# Patient Record
Sex: Female | Born: 1970 | Race: White | Hispanic: Yes | Smoking: Never smoker
Health system: Southern US, Community
[De-identification: ages and names within clinical notes are randomized; demographics above are authoritative.]

## PROBLEM LIST (undated history)

## (undated) DIAGNOSIS — I10 Essential (primary) hypertension: Secondary | ICD-10-CM

## (undated) HISTORY — PX: ABDOMINAL SURGERY: SHX537

## (undated) HISTORY — DX: Essential (primary) hypertension: I10

---

## 2004-12-16 HISTORY — PX: CERVICAL BIOPSY  W/ LOOP ELECTRODE EXCISION: SUR135

## 2009-03-24 ENCOUNTER — Other Ambulatory Visit: Admission: RE | Admit: 2009-03-24 | Discharge: 2009-03-24 | Payer: Self-pay | Admitting: Gynecology

## 2009-03-24 ENCOUNTER — Encounter: Payer: Self-pay | Admitting: Gynecology

## 2009-03-24 ENCOUNTER — Ambulatory Visit: Payer: Self-pay | Admitting: Gynecology

## 2009-04-12 ENCOUNTER — Ambulatory Visit (HOSPITAL_COMMUNITY): Admission: RE | Admit: 2009-04-12 | Discharge: 2009-04-12 | Payer: Self-pay | Admitting: Gynecology

## 2014-03-09 ENCOUNTER — Ambulatory Visit (INDEPENDENT_AMBULATORY_CARE_PROVIDER_SITE_OTHER): Payer: BC Managed Care – PPO | Admitting: Gynecology

## 2014-03-09 ENCOUNTER — Other Ambulatory Visit (HOSPITAL_COMMUNITY)
Admission: RE | Admit: 2014-03-09 | Discharge: 2014-03-09 | Disposition: A | Discharge status: Home / Self Care | Payer: BC Managed Care – PPO | Source: Ambulatory Visit | Attending: Gynecology | Admitting: Gynecology

## 2014-03-09 ENCOUNTER — Encounter: Payer: Self-pay | Admitting: Gynecology

## 2014-03-09 VITALS — BP 130/88 | Ht 62.75 in | Wt 129.0 lb

## 2014-03-09 DIAGNOSIS — Z01419 Encounter for gynecological examination (general) (routine) without abnormal findings: Secondary | ICD-10-CM | POA: Insufficient documentation

## 2014-03-09 DIAGNOSIS — N920 Excessive and frequent menstruation with regular cycle: Secondary | ICD-10-CM

## 2014-03-09 DIAGNOSIS — Z1151 Encounter for screening for human papillomavirus (HPV): Secondary | ICD-10-CM | POA: Insufficient documentation

## 2014-03-09 DIAGNOSIS — Z23 Encounter for immunization: Secondary | ICD-10-CM

## 2014-03-09 DIAGNOSIS — N841 Polyp of cervix uteri: Secondary | ICD-10-CM | POA: Insufficient documentation

## 2014-03-09 LAB — CBC WITH DIFFERENTIAL/PLATELET
Basophils Absolute: 0 10*3/uL (ref 0.0–0.1)
Basophils Relative: 0 % (ref 0–1)
EOS ABS: 0.2 10*3/uL (ref 0.0–0.7)
Eosinophils Relative: 2 % (ref 0–5)
HCT: 40.2 % (ref 36.0–46.0)
HEMOGLOBIN: 13.7 g/dL (ref 12.0–15.0)
LYMPHS ABS: 1.7 10*3/uL (ref 0.7–4.0)
LYMPHS PCT: 18 % (ref 12–46)
MCH: 28.6 pg (ref 26.0–34.0)
MCHC: 34.1 g/dL (ref 30.0–36.0)
MCV: 83.9 fL (ref 78.0–100.0)
MONOS PCT: 10 % (ref 3–12)
Monocytes Absolute: 1 10*3/uL (ref 0.1–1.0)
NEUTROS ABS: 6.7 10*3/uL (ref 1.7–7.7)
NEUTROS PCT: 70 % (ref 43–77)
PLATELETS: 269 10*3/uL (ref 150–400)
RBC: 4.79 MIL/uL (ref 3.87–5.11)
RDW: 13.9 % (ref 11.5–15.5)
WBC: 9.5 10*3/uL (ref 4.0–10.5)

## 2014-03-09 LAB — COMPREHENSIVE METABOLIC PANEL
ALBUMIN: 4.5 g/dL (ref 3.5–5.2)
ALT: 19 U/L (ref 0–35)
AST: 20 U/L (ref 0–37)
Alkaline Phosphatase: 73 U/L (ref 39–117)
BILIRUBIN TOTAL: 0.3 mg/dL (ref 0.2–1.2)
BUN: 13 mg/dL (ref 6–23)
CO2: 28 meq/L (ref 19–32)
Calcium: 9.4 mg/dL (ref 8.4–10.5)
Chloride: 104 mEq/L (ref 96–112)
Creat: 0.62 mg/dL (ref 0.50–1.10)
Glucose, Bld: 104 mg/dL — ABNORMAL HIGH (ref 70–99)
POTASSIUM: 4.1 meq/L (ref 3.5–5.3)
SODIUM: 138 meq/L (ref 135–145)
TOTAL PROTEIN: 7.2 g/dL (ref 6.0–8.3)

## 2014-03-09 LAB — TSH: TSH: 1.299 u[IU]/mL (ref 0.350–4.500)

## 2014-03-09 LAB — CHOLESTEROL, TOTAL: CHOLESTEROL: 187 mg/dL (ref 0–200)

## 2014-03-09 NOTE — Addendum Note (Signed)
Addended by: Bertram SavinGONZALEZ-CASTILLO, BLANCA A on: 03/09/2014 02:58 PM   Modules accepted: Orders

## 2014-03-09 NOTE — Patient Instructions (Addendum)
Autoexamen de mamas  (Breast Self-Awareness) El autoexamen de mamas puede detectar problemas de manera temprana, prevenir complicaciones mdicas significativas y posiblemente salvar su vida. Al hacerlo, podr familiarizarse con el aspecto y forma de sus mamas, y observar cambios. Esto le permite descubrir cambios de manera precoz. Este autoexamen mensual le ofrece la tranquilidad de que sus senos estn en buen estado de salud. Una forma de aprender qu es normal para sus mamas y si sufren modificaciones es hacer un autoexamen.   Si encuentra un bulto o algo que no estaba presente anteriormente, lo mejor es ponerse en contacto con su mdico inmediatamente. Otro hallazgo que debe ser evaluado por su mdico es la secrecin del pezn, especialmente si es con sangre; cambios en la piel o enrojecimiento; reas donde la piel parece estar tironeada (retrada) o nuevos bultos o protuberancias. El dolor en los senos es rara vez se asocia con el cncer (malignidad), pero tambin debe ser evaluado por un mdico.  CMO REALIZAR EL AUTOEXAMEN DE MAMAS  El mejor momento para examinar sus mamas es a los 5 a 7 das despus de finalizado el perodo menstrual. Durante la menstruacin, las mamas estn ms abultadas y puede haber ms dificultad para encontrar modificaciones. Si no menstra, ha llegado a la menopausia, o le han extirpado el tero (histerectomia), usted debe examinar sus senos a intervalos regulares, por ejemplo cada mes. Si est amamantando, examine sus senos despus de alimentar al beb o despus de usar un extractor de leche. Los implantes mamarios no disminuyen el riesgo de bultos o tumores, por lo que debe seguir realizando el autoexamen de mamas como se recomienda. Hable con su mdico acerca de cmo determinar la diferencia entre el implante y el tejido mamario. Adems, debe consultar cuanta presin debe hacer durante el examen. Con el tiempo se familiarizar con las variaciones de las mamas y se sentir ms  cmoda para realizar el examen. Para el autoexamen deber quitarse toda la ropa de la cintura para arriba.  1.  Observe sus senos y pezones. Prese frente a un espejo en una habitacin con buena iluminacin. Con las manos en las caderas, presione las manos firmemente hacia abajo. Busque diferencias en la forma, el contorno y el tamao de un pecho al otro (asimetras). Entre las asimetras se incluyen arrugas, depresiones o protuberancias. Tambin, busque cambios en la piel, como reas enrojecidas o escamosas. Busque cambios en los pezones, como secreciones, hoyuelos, cambios en la posicin, o enrojecimiento. 2. Palpe cuidadosamente sus senos. Es mucho mejor hacerlo en la ducha o en la baera, mientras usa jabn o cuando est recostada sobre su espalda. Coloque el brazo (en el lado de la mama que se examina) por arriba de la cabeza. Use las yemas (no las puntas) de los tres dedos centrales de la mano opuesta para palpar. Comience en la zona de la axila, haga crculos de  de pulgada (2 cm) y vaya superponindolos. Utilice 3 niveles diferentes de presin (ligero, medio y firme) en cada crculo antes de pasar al siguiente. Se necesita una presin ligera para sentir los tejidos ms cercanos a la piel. La presin media ayudar a sentir el tejido mamario un poco ms profundo, mientras que se necesita una presin firme para palpar el tejido que se encuentra cerca de las costillas. Continuar superponiendo crculos y vaya hacia abajo, hasta sentir las costillas, por debajo del pecho. Luego mueva un espacio del ancho de un dedo hacia el centro del cuerpo. Siga con los crculos del  de pulgada (  2 cm) mientras va lentamente hacia la clavcula, cerca de la base del cuello. Contine con el examen hacia arriba y hacia abajo con las 3 intensidades de presin [employer] fast streamer a la mitad del pecho. Hgalo con cada seno cuidadosamente, buscando bultos o modificaciones. 3. Debe llevar un registro escrito con los cambios o los hallazgos  normales que encuentre para cada seno. Si registra esta informacin, no tiene que depender slo de la memoria para Designer, industrial/product, la sensibilidad o la ubicacin de los [place]. Anote en qu momento se encuentra del ciclo menstrual, si usted todava est menstruando. El tejido [employer] puede tener algunos bultos o tejidos engrosados. Sin embargo, consulte a su mdico si usted Animal nutritionist.   SOLICITE ATENCIN MDICA SI:   Observa cambios en la forma, en el contorno o el tamao de las mamas o los pezones.   Hay modificaciones en la piel, como zonas enrojecidas o escamosas en las mamas o en los pezones.   Tiene una secrecin anormal en los pezones.   Siente un nuevo bulto o reas engrosadas de [place] anormal.  Document Released: 12/02/2005 Document Revised: 11/18/2012 [facility] Patient Information 2014 [place], [place]. Tetanus, Diphtheria (Td) Vaccine What You Need to Know WHY GET VACCINATED? Tetanus  and diphtheria are very serious diseases. They are rare in the [place] today, but people who do become infected often have severe complications. Td vaccine is used to protect adolescents and adults from both of these diseases. Both tetanus and diphtheria are infections caused by bacteria. Diphtheria spreads from person to person through coughing or sneezing. Tetanus-causing bacteria enter the body through cuts, scratches, or wounds. TETANUS (Lockjaw) causes painful muscle tightening and stiffness, usually all over the body.  It can lead to tightening of muscles in the head and neck so you can't open your mouth, swallow, or sometimes even breathe. Tetanus kills about 1 out of every 5 people who are infected. DIPHTHERIA can cause a thick coating to form in the back of the throat.  It can lead to breathing problems, paralysis, heart failure, and death. Before vaccines, the [place] saw as many as 200,000 cases a year of diphtheria and hundreds of cases of  tetanus. Since vaccination began, cases of both diseases have dropped by about 99%. TD VACCINE Td vaccine can protect adolescents and adults from tetanus and diphtheria. Td is usually given as a booster dose every 10 years but it can also be given earlier after a severe and dirty wound or burn. Your doctor can give you more information. Td may safely be given at the same time as other vaccines. SOME PEOPLE SHOULD NOT GET THIS VACCINE  If you ever had a life-threatening allergic reaction after a dose of any tetanus or diphtheria containing vaccine, OR if you have a severe allergy to any part of this vaccine, you should not get Td. Tell your doctor if you have any severe allergies.  Talk to your doctor if you:  have epilepsy or another nervous system problem,  had severe pain or swelling after any vaccine containing diphtheria or tetanus,  ever had Guillain Barr Syndrome (GBS),  aren't feeling well on the day the shot is scheduled. RISKS OF A VACCINE REACTION With a vaccine, like any medicine, there is a chance of side effects. These are usually mild and go away on their own. Serious side effects are also possible, but are very rare. Most people who get Td vaccine do not have any problems with  it. Mild Problems  following Td (Did not interfere with activities)  Pain where the shot was given (about 8 people in 10)  Redness or swelling where the shot was given (about 1 person in 3)  Mild fever (about 1 person in 15)  Headache or Tiredness (uncommon) Moderate Problems following Td (Interfered with activities, but did not require medical attention)  Fever over 102 F (38.9 C) (rare) Severe Problems  following Td (Unable to perform usual activities; required medical attention)  Swelling, severe pain, bleeding, or redness in the arm where the shot was given (rare). Problems that could happen after any vaccine:  Brief fainting spells can happen after any medical procedure,  including vaccination. Sitting or lying down for about 15 minutes can help prevent fainting, and injuries caused by a fall. Tell your doctor if you feel dizzy, or have vision changes or ringing in the ears.  Severe shoulder pain and reduced range of motion in the arm where a shot was given can happen, very rarely, after a vaccination.  Severe allergic reactions from a vaccine are very rare, estimated at less than 1 in a million doses. If one were to occur, it would usually be within a few minutes to a few hours after the vaccination. WHAT IF THERE IS A SERIOUS REACTION? What should I look for?  Look for anything that concerns you, such as signs of a severe allergic reaction, very high fever, or behavior changes. Signs of a severe allergic reaction can include hives, swelling of the face and throat, difficulty breathing, a fast heartbeat, dizziness, and weakness. These would usually start a few minutes to a few hours after the vaccination. What should I do?  If you think it is a severe allergic reaction or other emergency that can't wait, call 911 or get the person to the nearest hospital. Otherwise, call your doctor.  Afterward, the reaction should be reported to the Vaccine Adverse Event Reporting System (VAERS). Your doctor might file this report, or, you can do it yourself through the VAERS website or by calling 1-636-340-8055. VAERS is only for reporting reactions. They do not give medical advice. THE NATIONAL VACCINE INJURY COMPENSATION PROGRAM The National Vaccine Injury Compensation Program (VICP) is a federal program that was created to compensate people who may have been injured by certain vaccines. Persons who believe they may have been injured by a vaccine can learn about the program and about filing a claim by calling 1-918-614-4728 or visiting the [facility] website. HOW CAN I LEARN MORE?  Ask your doctor.  Contact your local or state health department.  Contact the Centers for  Disease Control and Prevention (CDC):  Call 979-324-63271-(580) 396-4083 (1-800-CDC-INFO)  Visit CDC's vaccines website CDC Td Vaccine Interim VIS (01/19/13) Document Released: 09/29/2006 Document Revised: 03/29/2013 Document Reviewed: 03/24/2013 [facility] Patient Information 2014 [place]

## 2014-03-09 NOTE — Progress Notes (Signed)
Kristen Hammond 06/18/1971 [id number]   History:    43 y.o.  for annual gyn exam who has not been seen in the office for 5 years. She did state that she had a Pap smear at the health department in 2010 which was normal. She has informed me that over 10 years ago she had a LEEP cervical conization in [place] but does not recall the degree of dysplasia but subsequent Pap smears have been normal. Her last mammogram was in 2010. Her husband has had a vasectomy. She did state that when she was in [place] a few years ago seen a gynecologist he removed a polyp from her cervix. She sometimes has some spotting after intercourse. And reports her cycles are at times heavy with passage of large clots. She was receiving hypertensive medication from [place] is currently not see any internists at the present time. She has not had her Tdap vaccine.  Past medical history,surgical history, family history and social history were all reviewed and documented in the EPIC chart.  Gynecologic History Patient's last menstrual period was 02/21/2014. Contraception: vasectomy Last Pap: 2010. Results were: normal Last mammogram: 2010. Results were: normal  Obstetric History OB History  Gravida Para Term Preterm AB SAB TAB Ectopic Multiple Living  2 2        2     # Outcome Date GA Lbr Len/2nd Weight Sex Delivery Anes PTL Lv  2 PAR           1 PAR                ROS: A ROS was performed and pertinent positives and negatives are included in the history.  GENERAL: No fevers or chills. HEENT: No change in vision, no earache, sore throat or sinus congestion. NECK: No pain or stiffness. CARDIOVASCULAR: No chest pain or pressure. No palpitations. PULMONARY: No shortness of breath, cough or wheeze. GASTROINTESTINAL: No abdominal pain, nausea, vomiting or diarrhea, melena or bright red blood per rectum. GENITOURINARY: No urinary frequency, urgency, hesitancy or dysuria. MUSCULOSKELETAL: No  joint or muscle pain, no back pain, no recent trauma. DERMATOLOGIC: No rash, no itching, no lesions. ENDOCRINE: No polyuria, polydipsia, no heat or cold intolerance. No recent change in weight. HEMATOLOGICAL: No anemia or easy bruising or bleeding. NEUROLOGIC: No headache, seizures, numbness, tingling or weakness. PSYCHIATRIC: No depression, no loss of interest in normal activity or change in sleep pattern.     Exam: chaperone present  BP 130/88  Ht 5' 2.75" (1.594 m)  Wt 129 lb (58.514 kg)  BMI 23.03 kg/m2  LMP 02/21/2014  Body mass index is 23.03 kg/(m^2).  General appearance : Well developed well nourished female. No acute distress HEENT: Neck supple, trachea midline, no carotid bruits, no thyroidmegaly Lungs: Clear to auscultation, no rhonchi or wheezes, or rib retractions  Heart: Regular rate and rhythm, no murmurs or gallops Breast:Examined in sitting and supine position were symmetrical in appearance, no palpable masses or tenderness,  no skin retraction, no nipple inversion, no nipple discharge, no skin discoloration, no axillary or supraclavicular lymphadenopathy Abdomen: no palpable masses or tenderness, no rebound or guarding Extremities: no edema or skin discoloration or tenderness  Pelvic:  Bartholin, Urethra, Skene Glands: Within normal limits             Vagina: No gross lesions or discharge  Cervix: No gross lesions or discharge, cervical polyp  Uterus  anteverted, normal size, shape and consistency, non-tender and mobile  Adnexa  Without masses or tenderness  Anus and perineum  normal   Rectovaginal  normal sphincter tone without palpated masses or tenderness             Hemoccult that indicated     Assessment/Plan:  43 y.o. female for annual exam who was found to have a cervical polyp which was twisted off its base and submitted for histological evaluation. Will have patient return to the office next week for a sonohysterogram to evaluate the intrauterine cavity  since she has had postcoital bleeding and to reassure that there are not any additional polyps inside the uterine cavity. She will be given the name of one or more local internist for her to followup for her hypertension and medication. She was normotensive today. The following labs were were: CBC, screen cholesterol, conference metabolic panel, TSH, and urinalysis. She did receive a Tdap vaccine today. Pap smear with HPV screen was obtained today as well. She was reminded of the importance of monthly breast exam. A requisition for her to schedule a mammogram was provided as well.  Note: This dictation was prepared with  Dragon/digital dictation along withSmart phrase technology. Any transcriptional errors that result from this process are unintentional.   Ok EdwardsFERNANDEZ,Beda Dula H MD, 2:52 PM 03/09/2014

## 2014-03-09 NOTE — Addendum Note (Signed)
Addended by: Bertram SavinGONZALEZ-Evee Liska A on: 03/09/2014 03:01 PM   Modules accepted: Orders

## 2014-03-10 LAB — URINALYSIS W MICROSCOPIC + REFLEX CULTURE
BACTERIA UA: NONE SEEN
BILIRUBIN URINE: NEGATIVE
CASTS: NONE SEEN
CRYSTALS: NONE SEEN
Glucose, UA: NEGATIVE mg/dL
HGB URINE DIPSTICK: NEGATIVE
KETONES UR: NEGATIVE mg/dL
Leukocytes, UA: NEGATIVE
Nitrite: NEGATIVE
PH: 6 (ref 5.0–8.0)
Protein, ur: NEGATIVE mg/dL
SPECIFIC GRAVITY, URINE: 1.017 (ref 1.005–1.030)
UROBILINOGEN UA: 0.2 mg/dL (ref 0.0–1.0)

## 2014-03-14 ENCOUNTER — Other Ambulatory Visit: Payer: Self-pay | Admitting: Gynecology

## 2014-03-14 DIAGNOSIS — N920 Excessive and frequent menstruation with regular cycle: Secondary | ICD-10-CM

## 2014-03-14 DIAGNOSIS — N930 Postcoital and contact bleeding: Secondary | ICD-10-CM

## 2014-03-14 DIAGNOSIS — N841 Polyp of cervix uteri: Secondary | ICD-10-CM

## 2014-03-16 ENCOUNTER — Ambulatory Visit: Payer: BC Managed Care – PPO | Admitting: Gynecology

## 2014-03-16 ENCOUNTER — Other Ambulatory Visit: Payer: BC Managed Care – PPO

## 2014-03-30 ENCOUNTER — Ambulatory Visit: Payer: BC Managed Care – PPO | Admitting: *Deleted

## 2014-05-20 ENCOUNTER — Ambulatory Visit: Payer: BC Managed Care – PPO | Admitting: Family Medicine

## 2014-09-28 ENCOUNTER — Ambulatory Visit: Payer: BC Managed Care – PPO | Admitting: Gynecology

## 2014-09-29 ENCOUNTER — Ambulatory Visit (INDEPENDENT_AMBULATORY_CARE_PROVIDER_SITE_OTHER): Payer: BC Managed Care – PPO | Admitting: Gynecology

## 2014-09-29 ENCOUNTER — Encounter: Payer: Self-pay | Admitting: Gynecology

## 2014-09-29 VITALS — BP 134/80 | Ht 62.0 in | Wt 124.0 lb

## 2014-09-29 DIAGNOSIS — K648 Other hemorrhoids: Secondary | ICD-10-CM

## 2014-09-29 DIAGNOSIS — N938 Other specified abnormal uterine and vaginal bleeding: Secondary | ICD-10-CM

## 2014-09-29 LAB — TSH: TSH: 2.455 u[IU]/mL (ref 0.350–4.500)

## 2014-09-29 LAB — PREGNANCY, URINE: PREG TEST UR: NEGATIVE

## 2014-09-29 NOTE — Patient Instructions (Signed)
Hemorroides  (Hemorrhoids) Las hemorroides son venas inflamadas alrededor del recto o del ano. Hay dos tipos de hemorroides:   Hemorroides internas. Se forman en las venas del interior del recto. Pueden abultarse hacia el exterior e irritarse y Secretary/administrator.  Hemorroides externas. Se producen en las venas externas al ano y pueden sentirse como un bulto o zona hinchada dura y dolorosa cerca del ano. CAUSAS   Embarazo.   Obesidad.   Constipacin o diarrea.   Dificultad para mover el intestino.   Permanecer sentado durante largos perodos en el inodoro.  Levantar objetos pesados u otras actividades que impliquen esfuerzo.  Sexo anal. SNTOMAS   Dolor.   Picazn o irritacin anal.   Sangrado rectal.   Prdida fecal.   Inflamacin anal.   Uno o ms bultos en la zona anal.  DIAGNSTICO  El mdico puede diagnosticar las hemorroides mediante un examen visual. Otros estudios o anlisis que se pueden realizar son:   Examen de la zona rectal con Ardelia Mems mano enguantada (examen digital rectal).   Examen del canal anal utilizando un pequeo tubo (endoscopio).   Anlisis de sangre si ha perdido [place] cantidad significativa de [place].  Un estudio para observar el interior del colon (sigmoideoscopa o colonoscopa). TRATAMIENTO  La mayora de las hemorroides pueden tratarse en casa. Sin embargo, si los sntomas no mejoran o tiene Nurse, children's, el mdico puede Optometrist un procedimiento para disminuir las hemorroides o extirparlas completamente. Los tratamientos posibles son:   Colocacin de una banda de goma en la base de la hemorroide para cortar la circulacin (ligadura con Forensic psychologist).   Inyeccin de una sustancia qumica para Neurosurgeon las hemorroides (escleroterapia).   Utilizacin de un instrumento para quemar las hemorroides (terapia con luz infrarroja).   Extirpacin quirrgica de la hemorroides (hemorroidectoma).   Colocacin de grapas en la hemorroides para  bloquear el flujo de sangre a los tejidos (engrapado de hemorroides).  INSTRUCCIONES PARA EL CUIDADO EN EL HOGAR   Consuma alimentos con fibra, como cereales integrales, legumbres, frutos secos, frutas y verduras. Pregntele a su mdico acerca de tomar productos con fibra aadida en ellos (suplementos defibra).  Aumente la ingestin de lquidos. Beba gran cantidad de lquido para mantener la orina de tono claro o color amarillo plido.   Haga ejercicios regularmente.   Vaya al bao cuando sienta la necesidad de mover el intestino. No espere.   Evite hacer fuerza al mover el intestino.   Mantenga la zona anal limpia y seca. Use papel higinico hmedo o toallitas humedecidas despus de mover el intestino.   Puede usar o Midwife segn las indicaciones algunas cremas especiales y supositorios.   Tome slo medicamentos de venta libre o recetados, segn las indicaciones del mdico.   Tome baos de asiento tibios durante 15-20 minutos, 3-4 veces por da para Glass blower/designer y las [place].   Coloque una bolsa de hielo sobre las hemorroides si le duelen o se hinchan. Usar compresas de [employer]ant baos de asiento puede ser [place].   Ponga el hielo en una bolsa plstica.   Colquese una toalla entre la piel y la bolsa de hielo.   Deje el hielo durante 15 - 20 minutos y aplquelo 3 - 4 veces por Training and development officer.   No utilice una almohada en forma de aro ni se siente en el inodoro durante perodos prolongados. Esto aumenta la afluencia de sangre y Conservation officer, historic buildings.  SOLICITE ATENCIN MDICA SI:   Aumenta el dolor y la hinchazn y no  puede controlarlo con la medicacin o con un tratamiento.  Tiene un sangrado que no IT consultant.  No puede mover el intestino.  Siente dolor o tiene inflamacin fuera de la zona de las hemorroides. ASEGRESE DE QUE:   Comprende estas instrucciones.  Controlar su enfermedad.  Recibir ayuda de inmediato si no mejora o si empeora. Document  Released: 12/02/2005 Document Revised: 08/04/2013 [facility] Patient Information 2015 [place]. This information is not intended to replace advice given to you by your health care provider. Make sure you discuss any questions you have with your health care provider. Ecografa transvaginal (Transvaginal Ultrasound) La ecografa transvaginal es una ecografa plvica en la que se utiliza una probeta metlica que se coloca en la vagina, para observar los rganos femeninos. El ecgrafo enva ondas sonoras desde un transductor (sonda). Estas ondas sonoras chocan contra las estructuras del cuerpo (como un eco) y crean Proofreader. La imagen se observa en un monitor. Se denomina transvaginal debido a que la sonda se inserta dentro de la vagina. Puede haber una pequea molestia por la introduccin de la sonda. Esta prueba tambin puede realizarse [employer]. La ecografa endovaginal es otro nombre para la ecografa transvaginal. En una ecografa transabdominal, la sonda se coloca en la parte externa del abdomen. Este mtodo no ofrece imgenes tan buenas como la tcnica transvaginal. La ecogafa transvaginal se utiliza para observar alteraciones en el tracto genital femenino. Entre ellos se incluyen:  Problemas de infertilidad.  Malformaciones congnitas (defecto de nacimiento) del tero y los ovarios.  Tumores en el tero.  Hemorragias anormales.  Tumores y quistes de ovario.  Abscesos (tejidos inflamados y pus) en la pelvis.  Dolor abdominal o plvico sin causa aparente.  Infecciones plvicas. DURANTE EL EMBARAZO, SE UTILIZA PAR OBSERVAR:  Embarazos normales.  Un embarazo ectpico (embarazo fuera del tero).  Latidos cardacos fetales.  Anormalidades de la pelvis que no se observan bien con la ecografa transabdominal.  Sospecha de gemelos o embarazo mltiple.  Aborto inminente.  Problemas en el cuello del tero (cuello incompetente, no permanece cerrado para contener al  beb).  Cuando se realiza una amniocentesis (se retira lquido de la bolsa [place], para ser [place]).  Al buscar anormalidades en el beb.  Para controlar el crecimiento, el desarrollo y la edad del feto.  Para medir la cantidad de lquido en el saco amnitico.  Cuando se realiza una versin externa del beb (se lo mueve a Programmer, applications).  Evaluar al beb en embarazos de alto riesgo (perfil biofsico).  Si se sospecha el deceso del beb (muerte). En algunos casos, se utiliza un mtodo especial denominado ecografa con infusin salina, para una observacin ms precisa del tero. Se inyecta solucin salina (agua con sal) dentro del tero en pacientes no embarazadas para observar mejor su interior. Este mtodo no se Designer, fashion/clothing. La probeta tambin puede usarse para obtener biopsias de Educational psychologist, para drenar lquido de quistes de ovario y para Designer, jewellery un DIU (dispositivo intrauterino para el control de la natalidad) que no pueda [place]. PREPARACIN PARA LA PRUEBA La ecografa transvaginal se realiza con la vejiga vaca. La ecografa transabdominal se realiza con la vejiga llena. Podrn solicitarle que beba varios vasos de agua antes del examen. En algunos casos se realiza una ecografa transabdominal antes de la ecografa transvaginal para obervar los rganos del abdomen. PROCEDIMIENTO  Deber acostarse en una cama, con las rodillas dobladas y los pies en los estribos. La probeta se cubre con un condn.  Dentro de la vagina y en la probeta se aplica un lubricante estril. El lubricante ayuda a transmitir las ondas sonoras y Fish farm manager la irritacin de la vagina. El mdico mover la sonda en el interior de la cavidad vaginal para escanear las estructuras plvicas. Un examen normal mostrar una pelvis normal y contenidos normales en su interior. Una prueba anormal mostrar anormalidades en la pelvis, la placenta o el beb. LAS CAUSAS DE UN RESULTADO ANORMAL PUEDEN  SER:  Crecimientos o tumores en:  El tero.  Los ovarios.  La vagina.  Otras estructuras plvicas.  Crecimientos no cancerosos en el tero y los ovarios.  El ovario se retuerce y se corta el suministro de [place] ([place]).  Las reas de infeccin incluyen:  Enfermedad inflamatoria plvica.  Un absceso en la pelvis.  Ubicacin de un DIU. LOS PROBLEMAS QUE PUEDEN HALLARSE EN UNA MUJER EMBARAZADA SON:  Embarazo ectpico (embarazo fuera del tero).  Embarazos mltiples.  Dilatacin (apertura) precoz anormal del cuello del tero. Esto puede indicar un cuello incompetente y Biomedical scientist.  Aborto inminente.  Muerte fetal.  Los problemas con la placenta incluyen:  La placenta se ha desarrollado sobre la abertura del cuello del tero (placenta previa).  La placenta se ha separado anticipadamente en el tero (abrupcin placentaria).  La placenta se desarrolla en el msculo del tero (placenta acreta).  Tumores del Media planner, incluyendo la enfermedad trofoblstica gestacional. Se trata de un embarazo anormal en el que no hay feto. El tero se llena de quistes similares a uvas que en algunos casos son cancerosos.  Posicin incorrecta del feto (de nalgas, de vrtice).  Retraso del desarrollo fetal intrauterino (escaso desarrollo en el tero).  Anormalidades o infeccin fetal. RIESGOS Y COMPLICACIONES No hay riesgos conocidos para la ecografa. No se toman radiografas cuando se realiza una ecografa. Document Released: 03/20/2009 Document Revised: 02/24/2012 [facility] Patient Information 2015 [place]. This information is not intended to replace advice given to you by your health care provider. Make sure you discuss any questions you have with your health care provider. Biopsia de endometrio (Endometrial Biopsy) La biopsia de endometrio es un procedimiento en el que se toma una [place] de tejido del tero. Luego la muestra de tejido se observa en el  microscopio para ver si el tejido es normal o anormal. El endometrio es el revestimiento interno del tero. Este procedimiento ayuda a determinar si est en el ciclo menstrual y de que modo los niveles de hormonas afectan el revestimiento del tero. Este procedimiento tambin se [place] para evaluar el sangrado uterino o para Electrical engineer de endometrio, tuberculosis, plipos o enfermedades inflamatorias.  INFORME A SU MDICO:  Cualquier alergia que tenga.  Todos los [employer] [place], incluyendo vitaminas, hierbas, gotas oftlmicas, cremas y medicamentos de venta libre.  Problemas previos que usted o los [employer] de su familia hayan tenido con el uso de anestsicos.  Enfermedades de [employer].  Cirugas previas.  Padecimientos mdicos.  Posible embarazo. RIESGOS Y COMPLICACIONES Generalmente es un procedimiento seguro. Sin embargo, Games developer procedimiento, pueden surgir complicaciones. Las complicaciones posibles son:  Hemorragias.  Infecciones plvicas  Lesin en la pared del tero con el instrumento utilizado para tomar la biopsia (raro). ANTES DEL PROCEDIMIENTO   Lleve un registro de sus ciclos menstruales segn las indicaciones de su mdico. Puede ser necesario que programe el procedimiento para un momento especfico del ciclo menstrual.  Tendr que llevar un apsito sanitario para usar despus del procedimiento.  Pdale a  alguna persona que la lleve a su casa despus del procedimiento si le dan un medicamento para relajarse (sedante). PROCEDIMIENTO   Le podrn administrar un medicamento para relajarse.  Deber recostarse en una camilla con los pies y las piernas elevados, como en el examen plvico.  El mdico insertar un instrumento (espculo) en la vagina para observar el cuello del tero.  El cuello del tero ser desinfectado con una solucin antisptica. Para adormecer el cuello del tero le aplicarn un medicamento (anestsico local).  Se  utilizar un frceps (tenculo) para Contractor International Paper.  Se insertar un instrumento delgado, similar a una varilla (sonda uterina) a travs del cuello del tero para Teacher, adult education su longitud y la ubicacin en la que ser tomada la muestra para la biopsia.  Luego se pasa un tubo delgado y flexible (catter) a travs del cuello del tero hasta el tero. El catter se [place] para Barista de tejido del endometrio para la biopsia.  El catter y el espculo se retirarn y la [place] se enviar al laboratorio para ser examinada. DESPUS DEL PROCEDIMIENTO  Descansar en una sala de recuperacin hasta que est lista para volver a su casa.  Sentir clicos leves y tendr una pequea cantidad de sangrado vaginal durante algunos das despus del procedimiento. Esto es normal.  Asegrese de [employer]. Document Released: 08/04/2013 Document Revised: 12/07/2013 [facility] Patient Information 2015 [place]. This information is not intended to replace advice given to you by your health care provider. Make sure you discuss any questions you have with your health care provider. Influenza Virus Vaccine injection (Fluarix) Qu es este medicamento? La VACUNA ANTIGRIPAL ayuda a disminuir el riesgo de contraer la influenza, tambin conocida como la gripe. La vacuna solo ayuda a protegerle contra algunas cepas de influenza. Esta vacuna no ayuda a reducir Catering manager de contraer influenza pandmica H1N1. Este medicamento puede ser utilizado para otros usos; si tiene alguna pregunta consulte con su proveedor de atencin mdica o con su farmacutico. MARCAS COMERCIALES DISPONIBLES: Fluarix, Fluzone Qu le debo informar a mi profesional de la salud antes de tomar este medicamento? Necesita saber si usted presenta alguno de los siguientes problemas o situaciones: -trastorno de sangrado como hemofilia -fiebre o infeccin -sndrome de Guillain-Barre u otros problemas  neurolgicos -problemas del sistema inmunolgico -infeccin por el virus de la inmunodeficiencia humana (VIH) o SIDA -niveles bajos de plaquetas en la sangre -esclerosis mltiple -una Risk analyst o inusual a las vacunas antigripales, a los huevos, protenas de pollo, al ltex, a la gentamicina, a otros medicamentos, alimentos, colorantes o conservantes -si est embarazada o buscando quedar embarazada -si est amamantando a un beb Cmo debo utilizar este medicamento? Esta vacuna se administra mediante inyeccin por va intramuscular. Lo administra un profesional de [employer]	[place]. Recibir una copia de informacin escrita sobre la vacuna antes de cada vacuna. Asegrese de leer este folleto cada vez cuidadosamente. Este folleto puede cambiar con frecuencia. Hable con su pediatra para informarse acerca del uso de este medicamento en nios. Puede requerir atencin especial. Sobredosis: Pngase en contacto inmediatamente con un centro toxicolgico o una sala de urgencia si usted cree que haya tomado demasiado medicamento. ATENCIN: [employer] es solo para usted. No comparta este medicamento con nadie. Qu sucede si me olvido de una dosis? No se aplica en este caso. Qu puede interactuar con este medicamento? -quimioterapia o radioterapia -medicamentos que suprimen el sistema inmunolgico, tales como etanercept, anakinra, infliximab y adalimumab -medicamentos que tratan o previenen cogulos  sanguneos, como warfarina -fenitona -medicamentos esteroideos, como la prednisona o la cortisona -teofilina -vacunas Puede ser que esta lista no menciona todas las posibles interacciones. Informe a su profesional de KB Home	[place] de [employer] productos a base de hierbas, medicamentos de [place] o suplementos nutritivos que est tomando. Si usted fuma, consume bebidas alcohlicas o si utiliza drogas ilegales, indqueselo tambin a su profesional de KB Home	[place]. Algunas sustancias pueden interactuar con su  medicamento. A qu debo estar atento al usar [employer]? Informe a su mdico o a Barrister's clerk de la [employer] todos los efectos secundarios que persistan despus de 3 das. Llame a su proveedor de atencin mdica si se presentan sntomas inusuales dentro de las 6 semanas posteriores a la vacunacin. Es posible que todava pueda contraer la gripe, pero la enfermedad no ser tan fuerte como normalmente. No puede contraer la gripe de esta vacuna. La vacuna antigripal no le protege contra resfros u otras enfermedades que pueden causar [place]. Debe vacunarse cada ao. Qu efectos secundarios puedo tener al [employer] este medicamento? Efectos secundarios que debe informar a su mdico o a Barrister's clerk de la salud tan pronto como sea posible: -reacciones alrgicas como erupcin cutnea, picazn o urticarias, hinchazn de la cara, labios o lengua Efectos secundarios que, por lo general, no requieren atencin mdica (debe informarlos a su mdico o a su profesional de la salud si persisten o si son molestos): -fiebre -dolor de cabeza -molestias y dolores musculares -dolor, sensibilidad, enrojecimiento o Estate agent de la inyeccin -cansancio o debilidad Puede ser que esta lista no menciona todos los posibles efectos secundarios. Comunquese a su mdico por asesoramiento mdico [employer]. Usted puede informar los efectos secundarios a la FDA por telfono al 1-800-FDA-1088. Dnde debo guardar mi medicina? Esta vacuna se administra solamente en clnicas, farmacias, consultorio mdico u otro consultorio de un profesional de la salud y no Sports coach en su domicilio. ATENCIN: Este folleto es un resumen. Puede ser que no cubra toda la posible informacin. Si usted tiene preguntas acerca de esta medicina, consulte con su mdico, su farmacutico o su profesional de Technical sales engineer.  2015, Elsevier/Gold Standard. (2010-06-05 15:31:40)

## 2014-09-29 NOTE — Progress Notes (Signed)
   Patient is a 43 year old who presented to the office today complaining of irregular menstrual cycles since August of this year. Her last normal menstrual period was July 2015. In August and September she had 2 periods the last one was September 29 it lasted for 4 days. She has not had one in October yet. Her husband has had a vasectomy. Patient was seen early this year for her annual exam. Patient denies any nausea vomiting or breast tenderness. In March of this year she had a cervical polyp that was removed and she is no longer experiencing any postcoital bleeding or any discomfort from that area. Her Pap smear was normal this year as well. She denies any nipple discharge or any bloody vision or any unusual headaches.  Exam: Abdomen: Soft nontender no rebound or guarding Pelvic: Bartholin urethra Skene was within normal limits Vagina: No lesions or discharge Cervix: No lesions or discharge Uterus slightly retroverted no palpable masses or tenderness adnexa: No palpable masses or tenderness Rectal exam patient has a very small external hemorrhoid it appears to have been thrombosis but has drained and only a small hematoma noted. Rectal exam did not demonstrate any internal hemorrhoids.  Because of patient's intermenstrual bleeding she was counseled for an endometrial biopsy after her urine pregnancy test was confirmed to be negative. The cervix was cleansed with Betadine solution. A sterile Pipelle was introduced into the uterine cavity. Uterine cavity measured 7 cm. Very little tissue was obtained and was submitted for histological evaluation  Assessment/plan: #1 dysfunction uterine bleeding rule out endometrial pathology. Endometrial biopsy done result pending at time of this dictation. TSH and and prolactin will be drawn today. Patient returned back to the office in the next one-2 weeks for sonohysterogram. #2 resolving external hemorrhoid is no need surgical referral at this time. Conservative  measures will be done as follows. Patient will use Preparation H twice a day. In the evening she will apply Neosporin. She will do sitz baths 3 times a week. If he gets bigger or no improvement or becomes more tender she will contact the office and we will refer her to the general surgeon. It appears it may years ago in another city patient had banding of previous hemorrhoids. #3 patient received flu vaccine today.

## 2014-09-29 NOTE — Addendum Note (Signed)
Addended by: Kem ParkinsonBARNES, Alyana Kreiter on: 09/29/2014 03:00 PM   Modules accepted: Orders

## 2014-09-30 LAB — PROLACTIN: Prolactin: 5.7 ng/mL

## 2014-10-04 ENCOUNTER — Other Ambulatory Visit: Payer: Self-pay | Admitting: Gynecology

## 2014-10-04 DIAGNOSIS — N938 Other specified abnormal uterine and vaginal bleeding: Secondary | ICD-10-CM

## 2014-10-14 ENCOUNTER — Other Ambulatory Visit: Payer: BC Managed Care – PPO

## 2014-10-14 ENCOUNTER — Ambulatory Visit: Payer: BC Managed Care – PPO | Admitting: Gynecology

## 2014-10-17 ENCOUNTER — Encounter: Payer: Self-pay | Admitting: Gynecology

## 2015-02-23 ENCOUNTER — Other Ambulatory Visit: Payer: Self-pay | Admitting: Gynecology

## 2015-02-23 DIAGNOSIS — N938 Other specified abnormal uterine and vaginal bleeding: Secondary | ICD-10-CM

## 2015-02-27 ENCOUNTER — Other Ambulatory Visit: Payer: Self-pay | Admitting: Gynecology

## 2015-02-27 DIAGNOSIS — N939 Abnormal uterine and vaginal bleeding, unspecified: Secondary | ICD-10-CM

## 2015-03-13 ENCOUNTER — Telehealth: Payer: Self-pay | Admitting: Gynecology

## 2015-03-13 NOTE — Telephone Encounter (Signed)
03/13/15-I LM VM for pt daughter to call to tell her that her Mom's insurance covers the sonohystergram with a $40.00 copay and 20% coinsurance. Pt has not met the $500.00 out of pocket amount yet so her coinsurance will be $175.16 additional(20% of allowable $875.76 for the 2 u/s and the [id number]) If another bx is done it will be an additional $53.92. Mom doesn't speak English.wl

## 2015-03-15 ENCOUNTER — Ambulatory Visit (INDEPENDENT_AMBULATORY_CARE_PROVIDER_SITE_OTHER): Payer: 59

## 2015-03-15 ENCOUNTER — Other Ambulatory Visit: Payer: Self-pay | Admitting: Gynecology

## 2015-03-15 ENCOUNTER — Encounter: Payer: Self-pay | Admitting: Gynecology

## 2015-03-15 ENCOUNTER — Ambulatory Visit (INDEPENDENT_AMBULATORY_CARE_PROVIDER_SITE_OTHER): Payer: 59 | Admitting: Gynecology

## 2015-03-15 DIAGNOSIS — N939 Abnormal uterine and vaginal bleeding, unspecified: Secondary | ICD-10-CM | POA: Diagnosis not present

## 2015-03-15 DIAGNOSIS — N938 Other specified abnormal uterine and vaginal bleeding: Secondary | ICD-10-CM

## 2015-03-15 DIAGNOSIS — N831 Corpus luteum cyst of ovary, unspecified side: Secondary | ICD-10-CM

## 2015-03-15 NOTE — Progress Notes (Signed)
   Patient is a 44 year old who was seen back in October 2015 as a result of irregular menstrual cycles see previous notes for detail. She underwent an endometrial biopsy at that time and was instructed to return back to the office for sonohysterogram she has not done so until today. She states her since January she's been having some normal cycles they vary from 21-35 days no intermenstrual spotting. Her husband has had a vasectomy. In March 2015 she had a benign cervical polyp that was removed and she was no longer experiencing postcoital bleeding. Pap smear was normal last year as well. She is overdue for mammogram.  Endometrial biopsy October 2015: Diagnosis Endometrium, biopsy, uterus - BENIGN EARLY SECRETORY ENDOMETRIUM, NO ATYPIA, HYPERPLASIA OR MALIGNANCY.  Ultrasound/sono hysterogram today: Uterus measured 8.2 x 6.3 x 4.4 cm with endometrial stripe of 9.2 mm. Patient currently on day 24 of her cycle. Right ovary small corpus luteum cyst with positive color flow in the periphery measuring 20 x 13 mm. Left R was normal. No fluid in the cul-de-sac. The cervix was then cleansed with Betadine solution a sterile catheter was introduced into the uterine cavity normal saline was instilled and no intracavitary defect was noted.  Assessment/plan: Patient with no more reports irregular bleeding. Negative endometrial biopsy 2015 as well as normal Pap smear. Patient past history of having had a cervical polyp removed. No longer experience or postcoital bleeding. Patient was reminded to schedule her annual exam for next month and to come to the office in a fasting state for fasting blood work.

## 2015-04-05 ENCOUNTER — Other Ambulatory Visit (HOSPITAL_COMMUNITY)
Admission: RE | Admit: 2015-04-05 | Discharge: 2015-04-05 | Disposition: A | Discharge status: Home / Self Care | Payer: 59 | Source: Ambulatory Visit | Attending: Gynecology | Admitting: Gynecology

## 2015-04-05 ENCOUNTER — Ambulatory Visit (INDEPENDENT_AMBULATORY_CARE_PROVIDER_SITE_OTHER): Payer: 59 | Admitting: Gynecology

## 2015-04-05 ENCOUNTER — Encounter: Payer: Self-pay | Admitting: Gynecology

## 2015-04-05 VITALS — BP 116/74 | Ht 62.0 in | Wt 122.0 lb

## 2015-04-05 DIAGNOSIS — Z01419 Encounter for gynecological examination (general) (routine) without abnormal findings: Secondary | ICD-10-CM | POA: Insufficient documentation

## 2015-04-05 DIAGNOSIS — L659 Nonscarring hair loss, unspecified: Secondary | ICD-10-CM | POA: Diagnosis not present

## 2015-04-05 DIAGNOSIS — L680 Hirsutism: Secondary | ICD-10-CM | POA: Insufficient documentation

## 2015-04-05 LAB — CBC WITH DIFFERENTIAL/PLATELET
Basophils Absolute: 0.1 10*3/uL (ref 0.0–0.1)
Basophils Relative: 1 % (ref 0–1)
EOS PCT: 2 % (ref 0–5)
Eosinophils Absolute: 0.1 10*3/uL (ref 0.0–0.7)
HCT: 41.1 % (ref 36.0–46.0)
HEMOGLOBIN: 13.6 g/dL (ref 12.0–15.0)
LYMPHS ABS: 1.8 10*3/uL (ref 0.7–4.0)
LYMPHS PCT: 24 % (ref 12–46)
MCH: 28.2 pg (ref 26.0–34.0)
MCHC: 33.1 g/dL (ref 30.0–36.0)
MCV: 85.3 fL (ref 78.0–100.0)
MONO ABS: 0.8 10*3/uL (ref 0.1–1.0)
MPV: 11.3 fL (ref 8.6–12.4)
Monocytes Relative: 11 % (ref 3–12)
NEUTROS ABS: 4.5 10*3/uL (ref 1.7–7.7)
Neutrophils Relative %: 62 % (ref 43–77)
PLATELETS: 264 10*3/uL (ref 150–400)
RBC: 4.82 MIL/uL (ref 3.87–5.11)
RDW: 14.1 % (ref 11.5–15.5)
WBC: 7.3 10*3/uL (ref 4.0–10.5)

## 2015-04-05 MED ORDER — VALACYCLOVIR HCL 1 G PO TABS: ORAL_TABLET | ORAL | Status: DC

## 2015-04-05 NOTE — Progress Notes (Signed)
Kristen Hammond 10-10-1971 [id number]   History:    44 y.o.  for annual gyn exam with occasional sporadic midepigastric discomfort. Patient reports no change in bowel movement habits no gastroesophageal reflux. Patient last year had irregular menstrual cycle and had a normal endometrial biopsy and sonohysterogram. She now reports normal menstrual cycles. Her husband has had a vasectomy. Patient has not had a mammogram since 2010. Patient reported that over 10 years ago when he or she had a LEEP cervical conization she does not recall the degree of dysplasia we have no records and subsequent Pap smears of been normal.She did state that when she was in [place] a few years ago seen a gynecologist he removed a polyp from her cervix. Patient has brought to my attention that occasionally she has had some hair thinning.  Past medical history,surgical history, family history and social history were all reviewed and documented in the EPIC chart.  Gynecologic History Patient's last menstrual period was 03/19/2015. Contraception: vasectomy Last Pap: 2015. Results were: normal Last mammogram: 2010. Results were: Normal but dense  Obstetric History OB History  Gravida Para Term Preterm AB SAB TAB Ectopic Multiple Living  # Outcome Date GA Lbr Len/2nd Weight Sex Delivery Anes PTL Lv  2 Para           1 Para                ROS: A ROS was performed and pertinent positives and negatives are included in the history.  GENERAL: No fevers or chills. HEENT: No change in vision, no earache, sore throat or sinus congestion. NECK: No pain or stiffness. CARDIOVASCULAR: No chest pain or pressure. No palpitations. PULMONARY: No shortness of breath, cough or wheeze. GASTROINTESTINAL: No abdominal pain, nausea, vomiting or diarrhea, melena or bright red blood per rectum. GENITOURINARY: No urinary frequency, urgency, hesitancy or dysuria. MUSCULOSKELETAL: No joint or muscle pain, no  back pain, no recent trauma. DERMATOLOGIC: No rash, no itching, no lesions. ENDOCRINE: No polyuria, polydipsia, no heat or cold intolerance. No recent change in weight. HEMATOLOGICAL: No anemia or easy bruising or bleeding. NEUROLOGIC: No headache, seizures, numbness, tingling or weakness. PSYCHIATRIC: No depression, no loss of interest in normal activity or change in sleep pattern.     Exam: chaperone present  BP 116/74 mmHg  Ht  (1.575 m)  Wt 122 lb (55.339 kg)  BMI 22.31 kg/m2  LMP 03/19/2015  Body mass index is 22.31 kg/(m^2).  General appearance : Well developed well nourished female. No acute distress HEENT: Eyes: no retinal hemorrhage or exudates,  Neck supple, trachea midline, no carotid bruits, no thyroidmegaly Lungs: Clear to auscultation, no rhonchi or wheezes, or rib retractions  Heart: Regular rate and rhythm, no murmurs or gallops Breast:Examined in sitting and supine position were symmetrical in appearance, no palpable masses or tenderness,  no skin retraction, no nipple inversion, no nipple discharge, no skin discoloration, no axillary or supraclavicular lymphadenopathy Abdomen: no palpable masses or tenderness, no rebound or guarding Extremities: no edema or skin discoloration or tenderness  Pelvic:  Bartholin, Urethra, Skene Glands: Within normal limits             Vagina: No gross lesions or discharge  Cervix: No gross lesions or discharge  Uterus  anteverted, normal size, shape and consistency, non-tender and mobile  Adnexa  Without masses or tenderness  Anus and perineum  normal  Rectovaginal  normal sphincter tone without palpated masses or tenderness             Hemoccult not indicated     Assessment/Plan:  44 y.o. female for annual exam with nonspecific occasional midepigastric discomfort. I have asked patient to maintain a diary or log over the course the next 30 days and it becomes a recurrent issue to contact the office for possible referral to GI as  well as possible obtaining blood work for H. pylori and/or upper abdominal ultrasound. The following screening labs ordered today: CBC, fasting lipid profile, TSH, competence a metabolic panel, urinalysis and Pap smear. Patient was provided with a requisition to schedule her overdue mammogram. She was reminded of the importance of monthly breast exam.   Ok EdwardsFERNANDEZ,Markel Kurtenbach H MD, 10:08 AM 04/05/2015

## 2015-04-06 LAB — URINALYSIS W MICROSCOPIC + REFLEX CULTURE
Bilirubin Urine: NEGATIVE
Casts: NONE SEEN
Crystals: NONE SEEN
Glucose, UA: NEGATIVE mg/dL
Hgb urine dipstick: NEGATIVE
Ketones, ur: NEGATIVE mg/dL
Leukocytes, UA: NEGATIVE
Nitrite: NEGATIVE
Protein, ur: NEGATIVE mg/dL
Specific Gravity, Urine: 1.025 (ref 1.005–1.030)
Urobilinogen, UA: 0.2 mg/dL (ref 0.0–1.0)
pH: 5.5 (ref 5.0–8.0)

## 2015-04-06 LAB — COMPREHENSIVE METABOLIC PANEL
ALBUMIN: 4.3 g/dL (ref 3.5–5.2)
ALT: 17 U/L (ref 0–35)
AST: 19 U/L (ref 0–37)
Alkaline Phosphatase: 58 U/L (ref 39–117)
BUN: 14 mg/dL (ref 6–23)
CHLORIDE: 104 meq/L (ref 96–112)
CO2: 25 meq/L (ref 19–32)
Calcium: 9.8 mg/dL (ref 8.4–10.5)
Creat: 0.66 mg/dL (ref 0.50–1.10)
GLUCOSE: 89 mg/dL (ref 70–99)
Potassium: 5.2 mEq/L (ref 3.5–5.3)
SODIUM: 138 meq/L (ref 135–145)
TOTAL PROTEIN: 7.1 g/dL (ref 6.0–8.3)
Total Bilirubin: 0.7 mg/dL (ref 0.2–1.2)

## 2015-04-06 LAB — LIPID PANEL
CHOL/HDL RATIO: 3.4 ratio
Cholesterol: 191 mg/dL (ref 0–200)
HDL: 57 mg/dL (ref >=46)
LDL CALC: 115 mg/dL — AB (ref 0–99)
Triglycerides: 93 mg/dL (ref <150)
VLDL: 19 mg/dL (ref 0–40)

## 2015-04-06 LAB — CYTOLOGY - PAP

## 2015-04-06 LAB — TSH: TSH: 1.05 u[IU]/mL (ref 0.350–4.500)

## 2015-04-07 ENCOUNTER — Other Ambulatory Visit: Payer: Self-pay

## 2015-04-07 DIAGNOSIS — Z1231 Encounter for screening mammogram for malignant neoplasm of breast: Secondary | ICD-10-CM

## 2015-04-26 ENCOUNTER — Ambulatory Visit: Payer: Self-pay

## 2015-04-26 ENCOUNTER — Ambulatory Visit
Admission: RE | Admit: 2015-04-26 | Discharge: 2015-04-26 | Disposition: A | Discharge status: Home / Self Care | Payer: 59 | Source: Ambulatory Visit

## 2015-04-26 DIAGNOSIS — Z1231 Encounter for screening mammogram for malignant neoplasm of breast: Secondary | ICD-10-CM

## 2016-01-09 ENCOUNTER — Ambulatory Visit: Payer: Self-pay | Admitting: Gynecology

## 2016-04-05 ENCOUNTER — Encounter: Payer: 59 | Admitting: Gynecology

## 2016-04-12 ENCOUNTER — Encounter: Payer: Self-pay | Admitting: Gynecology

## 2016-04-12 ENCOUNTER — Ambulatory Visit (INDEPENDENT_AMBULATORY_CARE_PROVIDER_SITE_OTHER): Payer: BLUE CROSS/BLUE SHIELD | Admitting: Gynecology

## 2016-04-12 VITALS — BP 122/84 | Ht 62.0 in | Wt 124.6 lb

## 2016-04-12 DIAGNOSIS — Z01419 Encounter for gynecological examination (general) (routine) without abnormal findings: Secondary | ICD-10-CM

## 2016-04-12 MED ORDER — VALACYCLOVIR HCL 500 MG PO TABS: ORAL_TABLET | ORAL | Status: DC

## 2016-04-12 MED ORDER — ATENOLOL 50 MG PO TABS: 50.0000 mg | ORAL_TABLET | Freq: Every day | ORAL | Status: DC

## 2016-04-12 NOTE — Addendum Note (Signed)
Addended by: Richardson ChiquitoWILKINSON, Basir Niven S on: 04/12/2016 02:07 PM   Modules accepted: Orders

## 2016-04-12 NOTE — Progress Notes (Signed)
Kristen Hammond 10/18/1971 [id number]   History:    45 y.o.  for annual gyn exam with no complaints today. She has not seen [place] for quite some time and wanted her hypertensive medication atenolol to be refilled. She is taking ginkgo biloba which she states that is help with her vertigo. Patient's husband has had a vasectomy. Patient reports normal menstrual cycles.Patient reported that over 10 years ago when he or she had a LEEP cervical conization she does not recall the degree of dysplasia we have no records and subsequent Pap smears of been normal.She did state that when she was in [place] a few years ago seen a gynecologist he removed a polyp from her cervix. 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  Past medical history,surgical history, family history and social history were all reviewed and documented in the EPIC chart.  Gynecologic History Patient's last menstrual period was 04/10/2016 (exact date). Contraception: vasectomy Last Pap: 2016 . Results were: normal Last mammogram: 2016. Results were: Normal but dense  Obstetric History OB History  Gravida Para Term Preterm AB SAB TAB Ectopic Multiple Living  2 2        2     # Outcome Date GA Lbr Len/2nd Weight Sex Delivery Anes PTL Lv  2 Para           1 Para                ROS: A ROS was performed and pertinent positives and negatives are included in the history.  GENERAL: No fevers or chills. HEENT: No change in vision, no earache, sore throat or sinus congestion. NECK: No pain or stiffness. CARDIOVASCULAR: No chest pain or pressure. No palpitations. PULMONARY: No shortness of breath, cough or wheeze. GASTROINTESTINAL: No abdominal pain, nausea, vomiting or diarrhea, melena or bright red blood per rectum. GENITOURINARY: No urinary frequency, urgency, hesitancy or dysuria. MUSCULOSKELETAL: No joint or muscle  pain, no back pain, no recent trauma. DERMATOLOGIC: No rash, no itching, no lesions. ENDOCRINE: No polyuria, polydipsia, no heat or cold intolerance. No recent change in weight. HEMATOLOGICAL: No anemia or easy bruising or bleeding. NEUROLOGIC: No headache, seizures, numbness, tingling or weakness. PSYCHIATRIC: No depression, no loss of interest in normal activity or change in sleep pattern.     Exam: chaperone present  BP 122/84 mmHg  Ht 5\' 2"  (1.575 m)  Wt 124 lb 9.6 oz (56.518 kg)  BMI 22.78 kg/m2  LMP 04/10/2016 (Exact Date)  Body mass index is 22.78 kg/(m^2).  General appearance : Well developed well nourished female. No acute distress HEENT: Eyes: no retinal hemorrhage or exudates,  Neck supple, trachea midline, no carotid bruits, no thyroidmegaly Lungs: Clear to auscultation, no rhonchi or wheezes, or rib retractions  Heart: Regular rate and rhythm, no murmurs or gallops Breast:Examined in sitting and supine position were symmetrical in appearance, no palpable masses or tenderness,  no skin retraction, no nipple inversion, no nipple discharge, no skin discoloration, no axillary or supraclavicular lymphadenopathy Abdomen: no palpable masses or tenderness, no rebound or guarding Extremities: no edema or skin discoloration or tenderness  Pelvic:  Bartholin, Urethra, Skene Glands: Within normal limits             Vagina: No gross lesions or discharge  Cervix: No gross lesions or discharge  Uterus  anteverted, normal size, shape and consistency, non-tender and mobile  Adnexa  Without masses or tenderness  Anus and perineum  normal   Rectovaginal  normal sphincter tone without palpated masses or tenderness             Hemoccult not indicated     Assessment/Plan:  45 y.o. female for annual exam will return back to the office next week for fasting blood work consisting of the following: Comprehensive metabolic panel, fasting lipid profile, TSH, CBC, and urinalysis. Pap smear was done  today. Patient was reminded to schedule her mammogram at the request a three-dimensional due to the fact that her last mammogram indicated she has dense breasts. For HSV suppression she will be started on Valtrex 500 mg one by mouth daily.   Ok Edwards MD, 1:18 PM 04/12/2016

## 2016-04-15 LAB — PAP IG W/ RFLX HPV ASCU

## 2016-04-17 ENCOUNTER — Other Ambulatory Visit: Payer: BLUE CROSS/BLUE SHIELD

## 2016-04-17 DIAGNOSIS — Z01419 Encounter for gynecological examination (general) (routine) without abnormal findings: Secondary | ICD-10-CM

## 2016-04-17 LAB — CBC WITH DIFFERENTIAL/PLATELET
BASOS ABS: 55 {cells}/uL (ref 0–200)
Basophils Relative: 1 %
EOS ABS: 220 {cells}/uL (ref 15–500)
EOS PCT: 4 %
HCT: 39.3 % (ref 35.0–45.0)
Hemoglobin: 12.8 g/dL (ref 11.7–15.5)
LYMPHS PCT: 28 %
Lymphs Abs: 1540 cells/uL (ref 850–3900)
MCH: 27.7 pg (ref 27.0–33.0)
MCHC: 32.6 g/dL (ref 32.0–36.0)
MCV: 85.1 fL (ref 80.0–100.0)
MONOS PCT: 9 %
MPV: 10.7 fL (ref 7.5–12.5)
Monocytes Absolute: 495 cells/uL (ref 200–950)
NEUTROS ABS: 3190 {cells}/uL (ref 1500–7800)
NEUTROS PCT: 58 %
PLATELETS: 234 10*3/uL (ref 140–400)
RBC: 4.62 MIL/uL (ref 3.80–5.10)
RDW: 14.1 % (ref 11.0–15.0)
WBC: 5.5 10*3/uL (ref 3.8–10.8)

## 2016-04-17 LAB — TSH: TSH: 1.65 mIU/L

## 2016-04-18 LAB — COMPREHENSIVE METABOLIC PANEL
ALT: 11 U/L (ref 6–29)
AST: 15 U/L (ref 10–35)
Albumin: 4.2 g/dL (ref 3.6–5.1)
Alkaline Phosphatase: 61 U/L (ref 33–115)
BUN: 12 mg/dL (ref 7–25)
CHLORIDE: 105 mmol/L (ref 98–110)
CO2: 23 mmol/L (ref 20–31)
CREATININE: 0.68 mg/dL (ref 0.50–1.10)
Calcium: 9.2 mg/dL (ref 8.6–10.2)
GLUCOSE: 74 mg/dL (ref 65–99)
POTASSIUM: 4.4 mmol/L (ref 3.5–5.3)
SODIUM: 140 mmol/L (ref 135–146)
TOTAL PROTEIN: 6.9 g/dL (ref 6.1–8.1)
Total Bilirubin: 0.4 mg/dL (ref 0.2–1.2)

## 2016-04-18 LAB — LIPID PANEL
CHOL/HDL RATIO: 3.4 ratio (ref <=5.0)
CHOLESTEROL: 195 mg/dL (ref 125–200)
HDL: 57 mg/dL (ref >=46)
LDL CALC: 122 mg/dL (ref <130)
Triglycerides: 80 mg/dL (ref <150)
VLDL: 16 mg/dL (ref <30)

## 2016-04-18 LAB — URINALYSIS W MICROSCOPIC + REFLEX CULTURE
BACTERIA UA: NONE SEEN [HPF]
BILIRUBIN URINE: NEGATIVE
Casts: NONE SEEN [LPF]
Crystals: NONE SEEN [HPF]
GLUCOSE, UA: NEGATIVE
HGB URINE DIPSTICK: NEGATIVE
Ketones, ur: NEGATIVE
LEUKOCYTES UA: NEGATIVE
NITRITE: NEGATIVE
PH: 5.5 (ref 5.0–8.0)
PROTEIN: NEGATIVE
RBC / HPF: NONE SEEN RBC/HPF (ref <=2)
Specific Gravity, Urine: 1.009 (ref 1.001–1.035)
Yeast: NONE SEEN [HPF]

## 2016-04-19 ENCOUNTER — Other Ambulatory Visit: Payer: Self-pay | Admitting: Gynecology

## 2016-04-19 MED ORDER — NITROFURANTOIN MONOHYD MACRO 100 MG PO CAPS: 100.0000 mg | ORAL_CAPSULE | Freq: Two times a day (BID) | ORAL | Status: DC

## 2016-04-20 LAB — URINE CULTURE: Colony Count: >=100000

## 2016-05-02 ENCOUNTER — Other Ambulatory Visit: Payer: Self-pay

## 2016-05-02 DIAGNOSIS — Z1231 Encounter for screening mammogram for malignant neoplasm of breast: Secondary | ICD-10-CM

## 2016-06-19 ENCOUNTER — Ambulatory Visit
Admission: RE | Admit: 2016-06-19 | Discharge: 2016-06-19 | Disposition: A | Discharge status: Home / Self Care | Payer: BLUE CROSS/BLUE SHIELD | Source: Ambulatory Visit

## 2016-06-19 DIAGNOSIS — Z1231 Encounter for screening mammogram for malignant neoplasm of breast: Secondary | ICD-10-CM

## 2016-08-23 ENCOUNTER — Other Ambulatory Visit: Payer: Self-pay | Admitting: Anesthesiology

## 2016-08-23 MED ORDER — VALACYCLOVIR HCL 500 MG PO TABS
ORAL_TABLET | ORAL | 8 refills | Status: DC
Start: 2016-08-23 — End: 2016-10-08

## 2016-08-23 MED ORDER — ATENOLOL 50 MG PO TABS: 50.0000 mg | ORAL_TABLET | Freq: Every day | ORAL | 8 refills | Status: DC

## 2016-10-08 ENCOUNTER — Other Ambulatory Visit: Payer: Self-pay

## 2016-10-08 MED ORDER — ATENOLOL 50 MG PO TABS: 50.0000 mg | ORAL_TABLET | Freq: Every day | ORAL | 11 refills | Status: DC

## 2016-10-08 MED ORDER — VALACYCLOVIR HCL 500 MG PO TABS: ORAL_TABLET | ORAL | 11 refills | Status: DC

## 2016-10-10 ENCOUNTER — Other Ambulatory Visit: Payer: Self-pay

## 2016-10-10 MED ORDER — VALACYCLOVIR HCL 500 MG PO TABS: ORAL_TABLET | ORAL | 3 refills | Status: DC

## 2016-10-10 MED ORDER — ATENOLOL 50 MG PO TABS: 50.0000 mg | ORAL_TABLET | Freq: Every day | ORAL | 3 refills | Status: DC

## 2016-11-27 ENCOUNTER — Encounter: Payer: Self-pay | Admitting: Gynecology

## 2016-11-27 ENCOUNTER — Ambulatory Visit (INDEPENDENT_AMBULATORY_CARE_PROVIDER_SITE_OTHER): Payer: BLUE CROSS/BLUE SHIELD | Admitting: Gynecology

## 2016-11-27 VITALS — BP 134/82

## 2016-11-27 DIAGNOSIS — R350 Frequency of micturition: Secondary | ICD-10-CM | POA: Diagnosis not present

## 2016-11-27 DIAGNOSIS — R300 Dysuria: Secondary | ICD-10-CM | POA: Diagnosis not present

## 2016-11-27 DIAGNOSIS — N3001 Acute cystitis with hematuria: Secondary | ICD-10-CM

## 2016-11-27 LAB — URINALYSIS W MICROSCOPIC + REFLEX CULTURE
BILIRUBIN URINE: NEGATIVE
CRYSTALS: NONE SEEN [HPF]
Casts: NONE SEEN [LPF]
GLUCOSE, UA: NEGATIVE
Ketones, ur: NEGATIVE
Nitrite: NEGATIVE
PH: 5.5 (ref 5.0–8.0)
PROTEIN: NEGATIVE
Specific Gravity, Urine: 1.015 (ref 1.001–1.035)
Yeast: NONE SEEN [HPF]

## 2016-11-27 MED ORDER — NITROFURANTOIN MONOHYD MACRO 100 MG PO CAPS: 100.0000 mg | ORAL_CAPSULE | Freq: Two times a day (BID) | ORAL | 0 refills | Status: DC

## 2016-11-27 MED ORDER — PHENAZOPYRIDINE HCL 200 MG PO TABS: 200.0000 mg | ORAL_TABLET | Freq: Three times a day (TID) | ORAL | 0 refills | Status: DC | PRN

## 2016-11-27 NOTE — Patient Instructions (Signed)
Infección de las vías urinarias en los adultos  (Urinary Tract Infection, Adult)  Una infección urinaria (IU) es una infección en cualquier parte de las vías urinarias, que incluyen los riñones, los uréteres, la vejiga y la uretra. Estos órganos fabrican, almacenan y eliminan la orina del organismo. La IU puede ser una infección de la vejiga (cistitis) o infección de los riñones (pielonefritis).  CAUSAS  Esta infección puede ser causada por hongos, virus o bacterias. Las bacterias son las causas más comunes de las IU. Esta afección también puede ser provocada por no vaciar la vejiga por completo durante la micción en repetidas ocasiones.  FACTORES DE RIESGO  Es más probable que esta afección se manifieste si:  · Usted ignora la necesidad de orinar o retiene la orina durante largos períodos.  · No vacía la vejiga completamente durante la micción.  · Se limpia de atrás hacia adelante después de orinar o defecar, en el caso de que sea mujer.  · Está circuncidado, en el caso de que sea varón.  · Tiene estreñimiento.  · Tiene colocada una sonda urinaria permanente.  · Tiene debilitado el sistema de defensa (inmunitario) del cuerpo.  · Tiene una enfermedad que afecta los intestinos, los riñones o la vejiga.  · Tiene diabetes.  · Toma antibióticos con frecuencia o durante largos períodos, y los antibióticos ya no resultan eficaces para combatir algunos tipos de infecciones (resistencia a los antibióticos).  · Toma medicamentos que le irritan las vías urinarias.  · Está expuesto a sustancias químicas que le irritan las vías urinarias.  · Es mujer.  SÍNTOMAS  Los síntomas de esta afección incluyen lo siguiente:  · Fiebre.  · Micción frecuente o eliminación de pequeñas cantidades de orina con frecuencia.  · Necesidad urgente de orinar.  · Ardor o dolor al orinar.  · Orina con mal olor u olor atípico.  · Orina turbia.  · Dolor en la parte baja del abdomen o en la espalda.  · Dificultad para orinar.  · Sangre en la  orina.  · Vómitos o más apetito de lo normal.  · Diarrea o dolor abdominal.  · Secreción vaginal, si es mujer.  DIAGNÓSTICO  Esta afección se diagnostica mediante sus antecedentes médicos y un examen físico. También deberá proporcionar una muestra de orina para realizar análisis. Podrán indicarle otros estudios, por ejemplo:  · Análisis de sangre.  · Pruebas de detección de enfermedades de transmisión sexual (ETS).  Si ha tenido más de una IU, se pueden hacer estudios de diagnóstico por imágenes o una citoscopia para determinar la causa de las infecciones.  TRATAMIENTO  El tratamiento de esta afección suele incluir una combinación de dos o más de los siguientes:  · Antibióticos.  · Otros medicamentos para tratar las causas menos frecuentes de infección urinaria.  · Medicamentos de venta libre para aliviar el dolor.  · Consumo de la cantidad necesaria de agua para mantenerse hidratado.  INSTRUCCIONES PARA EL CUIDADO EN EL HOGAR  · Tome los medicamentos de venta libre y los recetados solamente como se lo haya indicado el médico.  · Si le recetaron un antibiótico, tómelo como se lo haya indicado el médico. No deje de tomar los antibióticos aunque comience a sentirse mejor.  · Evite el alcohol, la cafeína, el té y las bebidas gaseosas. Estas sustancias pueden irritar la vejiga.  · Beba suficiente líquido para mantener la orina clara o de color amarillo pálido.  · Concurra a todas las visitas de control como se   lo haya indicado el médico. Esto es importante.  · Asegúrese de lo siguiente:  ? Vaciar la vejiga con frecuencia y en su totalidad. No contener la orina durante largos períodos.  ? Vaciar la vejiga antes y después de tener relaciones sexuales.  ? Limpiar de adelante hacia atrás después de defecar, si es mujer. Usar cada trozo de papel una vez cuando se limpie.    SOLICITE ATENCIÓN MÉDICA SI:  · Siente dolor en la espalda.  · Tiene fiebre.  · Siente náuseas o vomita.  · Los síntomas no mejoran después de 3 días de  tratamiento.  · Los síntomas desaparecen y luego reaparecen.    SOLICITE ATENCIÓN MÉDICA DE INMEDIATO SI:  · Siente dolor intenso en la espalda o en la zona inferior del abdomen.  · Tiene vómitos y no puede tragar medicamentos ni agua.    Esta información no tiene como fin reemplazar el consejo del médico. Asegúrese de hacerle al médico cualquier pregunta que tenga.  Document Released: 09/11/2005 Document Revised: 03/25/2016 Document Reviewed: 10/23/2015  Elsevier Interactive Patient Education © 2017 [employer]

## 2016-11-27 NOTE — Progress Notes (Signed)
   Patient is a 45 year old that presented to the office today complaining of several day history of dysuria and frequency no back pain. No fever, chills, nausea, vomiting. Her symptoms started shortly after her very short menstrual period which started last week on Friday. Patient several weeks ago had gone to the urologist thinking that she had a urological problem she stated her exam was otherwise normal. Patient's husband had a vasectomy.  Exam: Gen. appearance well-developed well-nourished female in no acute distress Back: No CVA tenderness Abdomen: Soft nontender no rebound or guarding Pelvic: Bartholin urethra Skene was within normal limits Vagina: No lesions or discharge Cervix: No lesions or discharge Uterus anteverted normal size shape and consistency Adnexa no masses or tenderness Rectal exam not done  Urinalysis: Many bacteria, 10-20 WBC, 20-RBC and moderate mucus  Assessment/plan: Patient with clinical evidence of urinary tract infection will be treated with Macrobid one by mouth twice a day for 7 days. To alleviate some of her symptoms she will be prescribed Pyridium 200 mg take 1 by mouth 3 times a day for 3 days. If her symptoms of low abdominal pressure does not resolve after the completion of the antibiotic treatment for suspected urinary tract infection she will contact the office and we can followed with an ultrasound. Of note urine culture pending.

## 2016-11-27 NOTE — Addendum Note (Signed)
Addended by: Berna SpareASTILLO, Nikaya Nasby A on: 11/27/2016 12:52 PM   Modules accepted: Orders

## 2016-11-28 LAB — URINE CULTURE: Organism ID, Bacteria: NO GROWTH

## 2016-11-29 ENCOUNTER — Other Ambulatory Visit: Payer: Self-pay | Admitting: Anesthesiology

## 2016-11-29 DIAGNOSIS — R319 Hematuria, unspecified: Secondary | ICD-10-CM

## 2016-12-05 ENCOUNTER — Other Ambulatory Visit: Payer: Self-pay

## 2017-04-30 ENCOUNTER — Encounter: Payer: Self-pay | Admitting: Gynecology

## 2017-05-20 ENCOUNTER — Encounter: Payer: Self-pay | Admitting: Gynecology

## 2017-05-20 ENCOUNTER — Ambulatory Visit (INDEPENDENT_AMBULATORY_CARE_PROVIDER_SITE_OTHER): Payer: BLUE CROSS/BLUE SHIELD | Admitting: Gynecology

## 2017-05-20 VITALS — BP 124/80 | Ht 62.0 in | Wt 128.0 lb

## 2017-05-20 DIAGNOSIS — Z01419 Encounter for gynecological examination (general) (routine) without abnormal findings: Secondary | ICD-10-CM | POA: Diagnosis not present

## 2017-05-20 DIAGNOSIS — R42 Dizziness and giddiness: Secondary | ICD-10-CM | POA: Diagnosis not present

## 2017-05-20 DIAGNOSIS — G44001 Cluster headache syndrome, unspecified, intractable: Secondary | ICD-10-CM | POA: Diagnosis not present

## 2017-05-20 MED ORDER — VALACYCLOVIR HCL 500 MG PO TABS: ORAL_TABLET | ORAL | 4 refills | Status: DC

## 2017-05-20 MED ORDER — ATENOLOL 50 MG PO TABS: 50.0000 mg | ORAL_TABLET | Freq: Every day | ORAL | 4 refills | Status: DC

## 2017-05-20 MED ORDER — VALACYCLOVIR HCL 500 MG PO TABS: ORAL_TABLET | ORAL | 3 refills | Status: DC

## 2017-05-20 NOTE — Progress Notes (Signed)
Kristen Hammond [birth year] [id number] f   History:    46 y.o.  for annual gyn exam with a complaint of frequent headaches and lightheadedness. She stated in over 18 years ago she had following on nice shrinking had the back of her head but did not lose any consciousness. But down was recently she has very lightheaded and weak and would like to have a neurology referral.  Patient has not seen her amily practice doctor in [facility] for quite some time and wanted her hypertensive medication atenolol to be refilled. She is taking ginkgo biloba which she states that is help with her vertigo. Patient's husband has had a vasectomy. Patient reports normal menstrual cycles.Patient reported that over 10 years ago when he or she had a LEEP cervical conization she does not recall the degree of dysplasia we have no records and subsequent Pap smears of been normal.She did state that when she was in [place] a few years ago seen a gynecologist he removed a polyp from her cervix. 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  Patient reports normal menstrual cycles.  Past medical history,surgical history, family history and social history were all reviewed and documented in the EPIC chart.  Gynecologic History No LMP recorded. Contraception: vasectomy Last Pap: 2017. Results were: normal Last mammogram: 2017. Results were: normal  Obstetric History OB History  Gravida Para Term Preterm AB Living  2 2       2   SAB TAB Ectopic Multiple Live Births               # Outcome Date GA Lbr Len/2nd Weight Sex Delivery Anes PTL Lv  2 Para           1 Para                ROS: A ROS was performed and pertinent positives and negatives are included in the history.  GENERAL: No fevers or chills. HEENT: No change in vision, no earache, sore throat or sinus congestion. NECK: No pain or stiffness. CARDIOVASCULAR: No chest pain or  pressure. No palpitations. PULMONARY: No shortness of breath, cough or wheeze. GASTROINTESTINAL: No abdominal pain, nausea, vomiting or diarrhea, melena or bright red blood per rectum. GENITOURINARY: No urinary frequency, urgency, hesitancy or dysuria. MUSCULOSKELETAL: No joint or muscle pain, no back pain, no recent trauma. DERMATOLOGIC: No rash, no itching, no lesions. ENDOCRINE: No polyuria, polydipsia, no heat or cold intolerance. No recent change in weight. HEMATOLOGICAL: No anemia or easy bruising or bleeding. NEUROLOGIC: No headache, seizures, numbness, tingling or weakness. PSYCHIATRIC: No depression, no loss of interest in normal activity or change in sleep pattern.     Exam: chaperone present  BP 124/80   Ht 5\' 2"  (1.575 m)   Wt 128 lb (58.1 kg)   BMI 23.41 kg/m   Body mass index is 23.41 kg/m.  General appearance : Well developed well nourished female. No acute distress HEENT: Eyes: no retinal hemorrhage or exudates,  Neck supple, trachea midline, no carotid bruits, no thyroidmegaly Lungs: Clear to auscultation, no rhonchi or wheezes, or rib retractions  Heart: Regular rate and rhythm, no murmurs or gallops Breast:Examined in sitting and supine position were symmetrical in appearance, no palpable masses or tenderness,  no skin retraction, no nipple inversion, no nipple discharge, no skin discoloration, no axillary or supraclavicular lymphadenopathy Abdomen: no palpable masses or tenderness, no rebound or guarding Extremities: no  edema or skin discoloration or tenderness  Pelvic:  Bartholin, Urethra, Skene Glands: Within normal limits             Vagina: No gross lesions or discharge  Cervix: No gross lesions or discharge  Uterus  anteverted, normal size, shape and consistency, non-tender and mobile  Adnexa  Without masses or tenderness  Anus and perineum  normal   Rectovaginal  normal sphincter tone without palpated masses or tenderness             Hemoccult not indicated      Assessment/Plan:  46 y.o. female for annual exam will be referred to the neurologist as a result of her frequent headaches and lightheadedness. She will return to the office later this week in a fasting state for the following blood were: Comprehensive metabolic panel, fasting lipid profile, TSH, and CBC. Pap smear was done today because of her past history of cervical conization dysplasia. She was reminded schedule her mammogram for next month. We discussed importance of monthly breast exams. We discussed importance of calcium vitamin D and weightbearing exercise for osteoporosis prevention.   Ok EdwardsFERNANDEZ,Pamalee Marcoe H MD, 4:30 PM 05/20/2017

## 2017-05-20 NOTE — Addendum Note (Signed)
Addended by: Kem ParkinsonBARNES, Dewel Lotter on: 05/20/2017 04:39 PM   Modules accepted: Orders

## 2017-05-21 ENCOUNTER — Other Ambulatory Visit: Payer: BLUE CROSS/BLUE SHIELD

## 2017-05-21 LAB — LIPID PANEL
CHOL/HDL RATIO: 3.4 ratio (ref <5.0)
Cholesterol: 185 mg/dL (ref <200)
HDL: 54 mg/dL (ref >50)
LDL Cholesterol: 111 mg/dL — ABNORMAL HIGH (ref <100)
TRIGLYCERIDES: 98 mg/dL (ref <150)
VLDL: 20 mg/dL (ref <30)

## 2017-05-21 LAB — CBC WITH DIFFERENTIAL/PLATELET
Basophils Absolute: 0 cells/uL (ref 0–200)
Basophils Relative: 0 %
EOS ABS: 174 {cells}/uL (ref 15–500)
Eosinophils Relative: 3 %
HEMATOCRIT: 38.6 % (ref 35.0–45.0)
Hemoglobin: 12.3 g/dL (ref 11.7–15.5)
LYMPHS PCT: 27 %
Lymphs Abs: 1566 cells/uL (ref 850–3900)
MCH: 26.7 pg — ABNORMAL LOW (ref 27.0–33.0)
MCHC: 31.9 g/dL — AB (ref 32.0–36.0)
MCV: 83.9 fL (ref 80.0–100.0)
MONO ABS: 580 {cells}/uL (ref 200–950)
MPV: 10.8 fL (ref 7.5–12.5)
Monocytes Relative: 10 %
NEUTROS PCT: 60 %
Neutro Abs: 3480 cells/uL (ref 1500–7800)
Platelets: 224 10*3/uL (ref 140–400)
RBC: 4.6 MIL/uL (ref 3.80–5.10)
RDW: 14.3 % (ref 11.0–15.0)
WBC: 5.8 10*3/uL (ref 3.8–10.8)

## 2017-05-21 LAB — COMPREHENSIVE METABOLIC PANEL
ALBUMIN: 4 g/dL (ref 3.6–5.1)
ALK PHOS: 62 U/L (ref 33–115)
ALT: 15 U/L (ref 6–29)
AST: 18 U/L (ref 10–35)
BUN: 11 mg/dL (ref 7–25)
CALCIUM: 9 mg/dL (ref 8.6–10.2)
CO2: 23 mmol/L (ref 20–31)
Chloride: 106 mmol/L (ref 98–110)
Creat: 0.66 mg/dL (ref 0.50–1.10)
GLUCOSE: 92 mg/dL (ref 65–99)
POTASSIUM: 4.5 mmol/L (ref 3.5–5.3)
Sodium: 138 mmol/L (ref 135–146)
Total Bilirubin: 0.5 mg/dL (ref 0.2–1.2)
Total Protein: 6.9 g/dL (ref 6.1–8.1)

## 2017-05-21 LAB — PAP IG W/ RFLX HPV ASCU

## 2017-05-21 LAB — TSH: TSH: 1.65 m[IU]/L

## 2017-07-23 DIAGNOSIS — I10 Essential (primary) hypertension: Secondary | ICD-10-CM

## 2017-07-23 DIAGNOSIS — I6350 Cerebral infarction due to unspecified occlusion or stenosis of unspecified cerebral artery: Secondary | ICD-10-CM | POA: Diagnosis not present

## 2017-07-24 DIAGNOSIS — I10 Essential (primary) hypertension: Secondary | ICD-10-CM | POA: Diagnosis not present

## 2017-07-24 DIAGNOSIS — I6350 Cerebral infarction due to unspecified occlusion or stenosis of unspecified cerebral artery: Secondary | ICD-10-CM | POA: Diagnosis not present

## 2017-07-24 DIAGNOSIS — I6789 Other cerebrovascular disease: Secondary | ICD-10-CM | POA: Diagnosis not present

## 2017-09-11 ENCOUNTER — Other Ambulatory Visit: Payer: Self-pay | Admitting: Urology

## 2017-09-11 DIAGNOSIS — R310 Gross hematuria: Secondary | ICD-10-CM

## 2017-09-17 ENCOUNTER — Ambulatory Visit
Admission: RE | Admit: 2017-09-17 | Discharge: 2017-09-17 | Disposition: A | Discharge status: Home / Self Care | Payer: BLUE CROSS/BLUE SHIELD | Source: Ambulatory Visit | Attending: Urology | Admitting: Urology

## 2017-09-17 DIAGNOSIS — R310 Gross hematuria: Secondary | ICD-10-CM

## 2017-09-17 MED ORDER — IOPAMIDOL (ISOVUE-300) INJECTION 61%
125.0000 mL | Freq: Once | INTRAVENOUS | Status: AC | PRN
  Administered 2017-09-17: 125 mL via INTRAVENOUS

## 2017-09-18 ENCOUNTER — Other Ambulatory Visit: Payer: Self-pay | Admitting: Women's Health

## 2017-09-18 ENCOUNTER — Other Ambulatory Visit: Payer: Self-pay | Admitting: Gynecology

## 2017-09-18 DIAGNOSIS — Z1231 Encounter for screening mammogram for malignant neoplasm of breast: Secondary | ICD-10-CM

## 2017-09-30 DIAGNOSIS — Z01818 Encounter for other preprocedural examination: Secondary | ICD-10-CM

## 2017-10-08 ENCOUNTER — Ambulatory Visit
Admission: RE | Admit: 2017-10-08 | Discharge: 2017-10-08 | Disposition: A | Discharge status: Home / Self Care | Payer: BLUE CROSS/BLUE SHIELD | Source: Ambulatory Visit | Attending: Women's Health | Admitting: Women's Health

## 2017-10-08 DIAGNOSIS — Z1231 Encounter for screening mammogram for malignant neoplasm of breast: Secondary | ICD-10-CM

## 2017-11-14 ENCOUNTER — Other Ambulatory Visit: Payer: Self-pay

## 2017-11-14 NOTE — Telephone Encounter (Signed)
Former Dr. Manuela SchwartzJF's patient. He prescribed Atenolol in June at [facility] and it was good for one year. Not sure why they are requesting refill now.   She is scheduled to see you this June [facility]. Are you okay with me refilling it until [facility] w you?

## 2017-11-15 NOTE — Telephone Encounter (Signed)
Yes, can refill until I see her.

## 2017-11-17 MED ORDER — ATENOLOL 50 MG PO TABS: 50.0000 mg | ORAL_TABLET | Freq: Every day | ORAL | 2 refills | Status: DC

## 2017-11-17 NOTE — Telephone Encounter (Signed)
Refills sent

## 2018-05-21 ENCOUNTER — Encounter: Payer: BLUE CROSS/BLUE SHIELD | Admitting: Obstetrics & Gynecology

## 2018-07-04 ENCOUNTER — Other Ambulatory Visit: Payer: Self-pay | Admitting: Obstetrics & Gynecology

## 2018-07-06 NOTE — Telephone Encounter (Signed)
Former patient of Dr. Manuela SchwartzJF's.  Saw him last year on 05/20/18 and he wrote "Patient has not seen her am[facility] practice doctor in [facility] [place] for quite some time and wanted her hypertensive medication atenolol to be refilled." He gave her a 90 days with 2 refills.  She has appointment scheduled with you on 07/07/18. She is requesting 90 day refill.

## 2018-07-07 ENCOUNTER — Ambulatory Visit: Payer: BLUE CROSS/BLUE SHIELD | Admitting: Obstetrics & Gynecology

## 2018-07-07 ENCOUNTER — Encounter: Payer: Self-pay | Admitting: Obstetrics & Gynecology

## 2018-07-07 VITALS — BP 128/74 | Ht 61.75 in | Wt 131.0 lb

## 2018-07-07 DIAGNOSIS — Z01419 Encounter for gynecological examination (general) (routine) without abnormal findings: Secondary | ICD-10-CM | POA: Diagnosis not present

## 2018-07-07 DIAGNOSIS — L292 Pruritus vulvae: Secondary | ICD-10-CM | POA: Diagnosis not present

## 2018-07-07 DIAGNOSIS — Z1151 Encounter for screening for human papillomavirus (HPV): Secondary | ICD-10-CM | POA: Diagnosis not present

## 2018-07-07 DIAGNOSIS — Z9189 Other specified personal risk factors, not elsewhere classified: Secondary | ICD-10-CM

## 2018-07-07 LAB — WET PREP FOR TRICH, YEAST, CLUE

## 2018-07-07 MED ORDER — FLUCONAZOLE 150 MG PO TABS: 150.0000 mg | ORAL_TABLET | Freq: Every day | ORAL | 1 refills | Status: AC

## 2018-07-07 NOTE — Telephone Encounter (Signed)
Kristen Hammond spoke with patient and informed her. 

## 2018-07-07 NOTE — Progress Notes (Signed)
Kristen Hammond 1971/09/20 [id number]   History:    48 y.o. G2P2L2 Married.  71 yo daughter and 90 yo son.  RP:  Established patient presenting for annual gyn exam   HPI: Menses more irregular every 21-45 days x 1 year with mildly increased flow.  No BTB.  No pelvic pain.  Occasional vulvar itching.  No increased vaginal discharge.  No pain with IC.  Urine/BMs wnl.  Breasts wnl.  BMI 24.15.  Health labs with Fam MD.  Last Hb 12.3 in 05/2017.  H/O LEEP >10 yrs ago.  Past medical history,surgical history, family history and social history were all reviewed and documented in the EPIC chart.  Gynecologic History Patient's last menstrual period was 06/05/2018. Contraception: vasectomy Last Pap: 05/2017. Results were: Negative Last mammogram: 09/2017. Results were: Negative Bone Density: Never Colonoscopy: Never  Obstetric History OB History  Gravida Para Term Preterm AB Living  2 2       2   SAB TAB Ectopic Multiple Live Births               # Outcome Date GA Lbr Len/2nd Weight Sex Delivery Anes PTL Lv  2 Para           1 Para              ROS: A ROS was performed and pertinent positives and negatives are included in the history.  GENERAL: No fevers or chills. HEENT: No change in vision, no earache, sore throat or sinus congestion. NECK: No pain or stiffness. CARDIOVASCULAR: No chest pain or pressure. No palpitations. PULMONARY: No shortness of breath, cough or wheeze. GASTROINTESTINAL: No abdominal pain, nausea, vomiting or diarrhea, melena or bright red blood per rectum. GENITOURINARY: No urinary frequency, urgency, hesitancy or dysuria. MUSCULOSKELETAL: No joint or muscle pain, no back pain, no recent trauma. DERMATOLOGIC: No rash, no itching, no lesions. ENDOCRINE: No polyuria, polydipsia, no heat or cold intolerance. No recent change in weight. HEMATOLOGICAL: No anemia or easy bruising or bleeding. NEUROLOGIC: No headache, seizures, numbness, tingling or weakness. PSYCHIATRIC: No  depression, no loss of interest in normal activity or change in sleep pattern.     Exam:   BP 128/74   Ht 5' 1.75" (1.568 m)   Wt 131 lb (59.4 kg)   LMP 06/05/2018   BMI 24.15 kg/m   Body mass index is 24.15 kg/m.  General appearance : Well developed well nourished female. No acute distress HEENT: Eyes: no retinal hemorrhage or exudates,  Neck supple, trachea midline, no carotid bruits, no thyroidmegaly Lungs: Clear to auscultation, no rhonchi or wheezes, or rib retractions  Heart: Regular rate and rhythm, no murmurs or gallops Breast:Examined in sitting and supine position were symmetrical in appearance, no palpable masses or tenderness,  no skin retraction, no nipple inversion, no nipple discharge, no skin discoloration, no axillary or supraclavicular lymphadenopathy Abdomen: no palpable masses or tenderness, no rebound or guarding Extremities: no edema or skin discoloration or tenderness  Pelvic: Vulva: Normal             Vagina: No gross lesions or discharge.  Wet prep done.  Cervix: No gross lesions or discharge.  Pap/HR HPV done.  Uterus  AV, normal size, shape and consistency, non-tender and mobile  Adnexa  Without masses or tenderness  Anus: Normal   Assessment/Plan:  47 y.o. female for annual exam   1. Encounter for routine gynecological examination with Papanicolaou smear of cervix Normal gynecologic exam.  Pap test with  high risk HPV done today.  Breast exam normal.  Last mammogram in October 2018 was negative.  Health labs with family physician.  2. Relies on partner vasectomy for contraception  3. Vulvar itching Probably secondary to yeast vaginitis confirmed by wet prep positive.  Will treat with fluconazole 150 mg 1 tablet per mouth daily for 3 days.  Usage reviewed with patient and prescription sent to pharmacy.  Can use probiotic to prevent in the future. - WET PREP FOR TRICH, YEAST, CLUE  Other orders - fluconazole (DIFLUCAN) 150 MG tablet; Take 1 tablet  (150 mg total) by mouth daily for 3 days.  Counseling on above issues and coordination of care more than 50% of the time for 10 minutes.  Genia DelMarie-Lyne Jahmia Berrett MD, 4:30 PM 07/07/2018

## 2018-07-07 NOTE — Telephone Encounter (Signed)
She needs to schedule with Fam MD asap and with us for Annual/Gyn visit.  I sent 30 tab, no refill.

## 2018-07-07 NOTE — Patient Instructions (Signed)
1. Encounter for routine gynecological examination with Papanicolaou smear of cervix Normal gynecologic exam.  Pap test with high risk HPV done today.  Breast exam normal.  Last mammogram in October 2018 was negative.  Health labs with family physician.  2. Relies on partner vasectomy for contraception  3. Vulvar itching Probably secondary to yeast vaginitis confirmed by wet prep positive.  Will treat with fluconazole 150 mg 1 tablet per mouth daily for 3 days.  Usage reviewed with patient and prescription sent to pharmacy.  Can use probiotic to prevent in the future. - WET PREP FOR TRICH, YEAST, CLUE  Other orders - fluconazole (DIFLUCAN) 150 MG tablet; Take 1 tablet (150 mg total) by mouth daily for 3 days.  Kristen Hammond, it was a pleasure meeting you today!  I will inform you of your results as soon as they are available.

## 2018-07-09 LAB — PAP, TP IMAGING W/ HPV RNA, RFLX HPV TYPE 16,18/45: HPV DNA High Risk: NOT DETECTED

## 2018-11-23 ENCOUNTER — Other Ambulatory Visit: Payer: Self-pay | Admitting: Women's Health

## 2018-11-23 DIAGNOSIS — Z1231 Encounter for screening mammogram for malignant neoplasm of breast: Secondary | ICD-10-CM

## 2018-12-01 ENCOUNTER — Emergency Department (HOSPITAL_COMMUNITY)
Admission: EM | Admit: 2018-12-01 | Discharge: 2018-12-01 | Disposition: A | Discharge status: Home / Self Care | Payer: BLUE CROSS/BLUE SHIELD | Attending: Emergency Medicine | Admitting: Emergency Medicine

## 2018-12-01 ENCOUNTER — Emergency Department (HOSPITAL_BASED_OUTPATIENT_CLINIC_OR_DEPARTMENT_OTHER): Payer: BLUE CROSS/BLUE SHIELD

## 2018-12-01 DIAGNOSIS — R52 Pain, unspecified: Secondary | ICD-10-CM

## 2018-12-01 DIAGNOSIS — M79605 Pain in left leg: Secondary | ICD-10-CM | POA: Insufficient documentation

## 2018-12-01 DIAGNOSIS — Z8673 Personal history of transient ischemic attack (TIA), and cerebral infarction without residual deficits: Secondary | ICD-10-CM | POA: Diagnosis not present

## 2018-12-01 DIAGNOSIS — R200 Anesthesia of skin: Secondary | ICD-10-CM | POA: Diagnosis not present

## 2018-12-01 DIAGNOSIS — I10 Essential (primary) hypertension: Secondary | ICD-10-CM | POA: Insufficient documentation

## 2018-12-01 DIAGNOSIS — Z79899 Other long term (current) drug therapy: Secondary | ICD-10-CM | POA: Insufficient documentation

## 2018-12-01 LAB — CBC
HCT: 36.1 % (ref 36.0–46.0)
Hemoglobin: 10.8 g/dL — ABNORMAL LOW (ref 12.0–15.0)
MCH: 24.5 pg — AB (ref 26.0–34.0)
MCHC: 29.9 g/dL — ABNORMAL LOW (ref 30.0–36.0)
MCV: 82 fL (ref 80.0–100.0)
Platelets: 261 10*3/uL (ref 150–400)
RBC: 4.4 MIL/uL (ref 3.87–5.11)
RDW: 14 % (ref 11.5–15.5)
WBC: 7.9 10*3/uL (ref 4.0–10.5)
nRBC: 0 % (ref 0.0–0.2)

## 2018-12-01 LAB — BASIC METABOLIC PANEL
ANION GAP: 9 (ref 5–15)
BUN: 13 mg/dL (ref 6–20)
CO2: 23 mmol/L (ref 22–32)
Calcium: 9.3 mg/dL (ref 8.9–10.3)
Chloride: 107 mmol/L (ref 98–111)
Creatinine, Ser: 0.68 mg/dL (ref 0.44–1.00)
GFR calc Af Amer: >60 mL/min (ref >60)
GFR calc non Af Amer: >60 mL/min (ref >60)
Glucose, Bld: 112 mg/dL — ABNORMAL HIGH (ref 70–99)
Potassium: 4 mmol/L (ref 3.5–5.1)
Sodium: 139 mmol/L (ref 135–145)

## 2018-12-01 LAB — I-STAT BETA HCG BLOOD, ED (MC, WL, AP ONLY): I-stat hCG, quantitative: <5 m[IU]/mL (ref <5)

## 2018-12-01 NOTE — ED Provider Notes (Signed)
[facility] Note   CSN: [id number] Arrival date & time: 12/01/18  1607     History   Chief Complaint Chief Complaint  Patient presents with  . Numbness    HPI Kristen Hammond is a 47 y.o. female.  The history is provided by the patient, medical records and a relative. A language interpreter was used (Spanish video interpreter).   Kristen Hammond is a 47 y.o. female  with a PMH of HTN, prior TIA who presents to the Emergency Department complaining of left lower leg pain which began last night around 6PM.  Patient states that the pain starts just above her ankle and radiates just above the knee.  No pain any further than the knee.  She has a tingling sensation which she reports as a burning numbness.  She has hx of previous TIA involving left upper and lower extremity which were associated with numbness and weakness.  She states that this feels much different.  She reports that was just numbness and she could not feel the extremities, whereas this is a painful sensation which is quite different.  She denies any weakness.  Family and patient deny any slurred speech, confusion, change in gait.  She did have a flight to [place] last month.  Not on any estrogen or OCPs.  Should be on 81 mg aspirin, but has not taken that in the last month or 2.  No dizziness or lightheadedness.   Past Medical History:  Diagnosis Date  . Hypertension     Patient Active Problem List   Diagnosis Date Noted  . Alopecia 04/05/2015    Past Surgical History:  Procedure Laterality Date  . ABDOMINAL SURGERY    . CERVICAL BIOPSY  W/ LOOP ELECTRODE EXCISION  2006  . CESAREAN SECTION     X2     OB History    Gravida  2   Para  2   Term      Preterm      AB      Living  2     SAB      TAB      Ectopic      Multiple      Live Births               Home Medications    Prior to Admission medications   Medication Sig Start Date End  Date Taking? Authorizing Provider  aspirin EC 81 MG tablet Take 81 mg by mouth daily.   Yes [provider]  atenolol (TENORMIN) 50 MG tablet TAKE 1 TABLET BY MOUTH EVERY DAY Patient taking differently: Take 50 mg by mouth daily.  07/07/18  Yes Genia DelLavoie, Marie-Lyne, MD  TURMERIC PO Take 1 capsule by mouth See admin instructions. Take 1 capsule by mouth one to two times a day   Yes [provider]    Family History Family History  Problem Relation Age of Onset  . Hyperlipidemia Mother   . Heart disease Mother   . Hyperlipidemia Father   . Hyperlipidemia Maternal Grandmother   . Heart disease Maternal Grandmother   . Hyperlipidemia Maternal Grandfather   . Hyperlipidemia Paternal Grandmother   . Hyperlipidemia Paternal Grandfather   . Breast cancer Neg Hx     Social History Social History   Tobacco Use  . Smoking status: Never Smoker  . Smokeless tobacco: Never Used  Substance Use Topics  . Alcohol use: No    Alcohol/week: 0.0 standard  drinks  . Drug use: No     Allergies   Patient has no known allergies.   Review of Systems Review of Systems  Musculoskeletal: Positive for myalgias.  Neurological: Positive for numbness.  All other systems reviewed and are negative.    Physical Exam Updated Vital Signs BP 121/79 (BP Location: Right Arm)   Pulse (!) 52   Temp 98.1 F (36.7 C) (Oral)   Resp 16   SpO2 99%   Physical Exam Vitals signs and nursing note reviewed.  Constitutional:      General: She is not in acute distress.    Appearance: She is well-developed.  HENT:     Head: Normocephalic and atraumatic.  Cardiovascular:     Rate and Rhythm: Normal rate and regular rhythm.     Heart sounds: Normal heart sounds. No murmur.  Pulmonary:     Effort: Pulmonary effort is normal. No respiratory distress.     Breath sounds: Normal breath sounds.  Abdominal:     General: There is no distension.     Palpations: Abdomen is soft.     Tenderness:  There is no abdominal tenderness.  Musculoskeletal:     Comments: 5/5 muscle strength in upper and lower extremities bilaterally including strong and equal grip strength and dorsiflexion/plantar flexion No midline C/T/L spine tenderness.  Tenderness to palpation of left calf. No swelling appreciated. 2+ DP and sensation intact bilaterally.   Skin:    General: Skin is warm and dry.  Neurological:     Mental Status: She is alert and oriented to person, place, and time.     Comments: Alert, oriented, thought content appropriate, able to give a coherent history. Speech is clear and goal oriented, able to follow commands.  Cranial Nerves:  II:  Peripheral visual fields grossly normal, pupils equal, round, reactive to light III, IV, VI: EOM intact bilaterally, ptosis not present V,VII: smile symmetric, eyes kept closed tightly against resistance, facial light touch sensation equal VIII: hearing grossly normal IX, X: symmetric soft palate movement, uvula elevates symmetrically  XI: bilateral shoulder shrug symmetric and strong XII: midline tongue extension Sensory to light touch normal in all four extremities.  Normal finger-to-nose and rapid alternating movements. No drift.       ED Treatments / Results  Labs (all labs ordered are listed, but only abnormal results are displayed) Labs Reviewed  BASIC METABOLIC PANEL - Abnormal; Notable for the following components:      Result Value   Glucose, Bld 112 (*)    All other components within normal limits  CBC - Abnormal; Notable for the following components:   Hemoglobin 10.8 (*)    MCH 24.5 (*)    MCHC 29.9 (*)    All other components within normal limits  I-STAT BETA HCG BLOOD, ED ([facility], [facility], [facility] ONLY)    EKG None  Radiology Vas [place] Lower Extremity Venous (dvt) (only [facility])  Result Date: 12/01/2018  Lower Venous Study Indications: Pain.  Performing Technologist: Farrel Demark RDMS, RVT  Examination Guidelines: A complete evaluation  includes B-mode imaging, spectral Doppler, color Doppler, and power Doppler as needed of all accessible portions of each vessel. Bilateral testing is considered an integral part of a complete examination. Limited examinations for reoccurring indications may be performed as noted.  Left Venous Findings: +---------+---------------+---------+-----------+----------+-------+          CompressibilityPhasicitySpontaneityPropertiesSummary +---------+---------------+---------+-----------+----------+-------+ CFV      Full  Yes      Yes                          +---------+---------------+---------+-----------+----------+-------+ SFJ      Full                                                 +---------+---------------+---------+-----------+----------+-------+ FV Prox  Full                                                 +---------+---------------+---------+-----------+----------+-------+ FV Mid   Full                                                 +---------+---------------+---------+-----------+----------+-------+ FV DistalFull                                                 +---------+---------------+---------+-----------+----------+-------+ PFV      Full                                                 +---------+---------------+---------+-----------+----------+-------+ POP      Full           Yes      Yes                          +---------+---------------+---------+-----------+----------+-------+ PTV      Full                                                 +---------+---------------+---------+-----------+----------+-------+ PERO     Full                                                 +---------+---------------+---------+-----------+----------+-------+    Summary: Left: There is no evidence of deep vein thrombosis in the lower extremity. No cystic structure found in the popliteal fossa.  *See table(s) above for measurements and observations.     Preliminary     Procedures Procedures (including critical care time)  Medications Ordered in ED Medications - No data to display   Initial Impression / Assessment and Plan / ED Course  I have reviewed the triage vital signs and the nursing notes.  Pertinent labs & imaging results that were available during my care of the patient were reviewed by me and considered in my medical decision making (see chart for details).    Kristen Hammond is a 47 y.o. female who presents to ED for left lower leg pain since 6PM last night. No  associated weakness. No focal neuro deficits on exam. Numbness described actually sounds more like a burning pain. No decreased sensation on exam. Does have calf tenderness. DVT ultrasound negative. Evaluation does not show pathology that would require ongoing emergent intervention or inpatient treatment. Will have her follow up with PCP for further evaluation.  Reasons to return to the emergency department were discussed and all questions answered.  Patient discussed with Dr. Jeraldine Loots who agrees with treatment plan.    Final Clinical Impressions(s) / ED Diagnoses   Final diagnoses:  Leg pain, left    ED Discharge Orders    None       Tura Roller, Chase Picket, PA-C 12/01/18 1839    Gerhard Munch, MD 12/01/18 1859

## 2018-12-01 NOTE — ED Notes (Signed)
Patient verbalizes understanding of discharge instructions. Opportunity for questioning and answers were provided. Armband removed by staff, pt discharged from ED. Pt ambulatory to lobby.  

## 2018-12-01 NOTE — ED Triage Notes (Signed)
Pt reports left leg numbness since 6pm yesterday with intermittent pain. Hx of CVA 1 year ago

## 2018-12-01 NOTE — ED Notes (Signed)
ED Provider at bedside. 

## 2018-12-01 NOTE — Progress Notes (Signed)
LLE venous duplex       has been completed. Preliminary results can be found under CV proc through chart review. Ameenah Prosser, BS, RDMS, RVT    

## 2018-12-01 NOTE — Discharge Instructions (Signed)
It was my pleasure taking care of you today!   Fortunately, your ultrasound was normal. There was no sign of blood clots.   As we discussed, I would like you to call your doctor in the morning to schedule a follow up appointment.   Return to ER for new or worsening symptoms, any additional concerns.

## 2018-12-30 ENCOUNTER — Ambulatory Visit
Admission: RE | Admit: 2018-12-30 | Discharge: 2018-12-30 | Disposition: A | Discharge status: Home / Self Care | Payer: BLUE CROSS/BLUE SHIELD | Source: Ambulatory Visit | Attending: Women's Health | Admitting: Women's Health

## 2018-12-30 DIAGNOSIS — Z1231 Encounter for screening mammogram for malignant neoplasm of breast: Secondary | ICD-10-CM

## 2019-07-08 ENCOUNTER — Other Ambulatory Visit: Payer: Self-pay

## 2019-07-09 ENCOUNTER — Encounter: Payer: BLUE CROSS/BLUE SHIELD | Admitting: Obstetrics & Gynecology

## 2020-02-15 ENCOUNTER — Other Ambulatory Visit: Payer: Self-pay

## 2020-02-17 ENCOUNTER — Other Ambulatory Visit: Payer: Self-pay

## 2020-02-17 ENCOUNTER — Encounter: Payer: Self-pay | Admitting: Obstetrics & Gynecology

## 2020-02-17 ENCOUNTER — Ambulatory Visit (INDEPENDENT_AMBULATORY_CARE_PROVIDER_SITE_OTHER): Payer: 59 | Admitting: Obstetrics & Gynecology

## 2020-02-17 VITALS — BP 134/82 | Ht 62.0 in | Wt 132.0 lb

## 2020-02-17 DIAGNOSIS — Z9889 Other specified postprocedural states: Secondary | ICD-10-CM

## 2020-02-17 DIAGNOSIS — N951 Menopausal and female climacteric states: Secondary | ICD-10-CM | POA: Diagnosis not present

## 2020-02-17 DIAGNOSIS — Z9189 Other specified personal risk factors, not elsewhere classified: Secondary | ICD-10-CM | POA: Diagnosis not present

## 2020-02-17 DIAGNOSIS — Z01419 Encounter for gynecological examination (general) (routine) without abnormal findings: Secondary | ICD-10-CM

## 2020-02-17 DIAGNOSIS — B002 Herpesviral gingivostomatitis and pharyngotonsillitis: Secondary | ICD-10-CM

## 2020-02-17 LAB — CBC
HCT: 42.6 % (ref 35.0–45.0)
Hemoglobin: 14.6 g/dL (ref 11.7–15.5)
MCH: 30.8 pg (ref 27.0–33.0)
MCHC: 34.3 g/dL (ref 32.0–36.0)
MCV: 89.9 fL (ref 80.0–100.0)
MPV: 11.4 fL (ref 7.5–12.5)
Platelets: 224 10*3/uL (ref 140–400)
RBC: 4.74 10*6/uL (ref 3.80–5.10)
RDW: 12.7 % (ref 11.0–15.0)
WBC: 6.5 10*3/uL (ref 3.8–10.8)

## 2020-02-17 LAB — LIPID PANEL
Cholesterol: 232 mg/dL — ABNORMAL HIGH (ref <200)
HDL: 61 mg/dL (ref >=50)
LDL Cholesterol (Calc): 147 mg/dL (calc) — ABNORMAL HIGH
Non-HDL Cholesterol (Calc): 171 mg/dL (calc) — ABNORMAL HIGH (ref <130)
Total CHOL/HDL Ratio: 3.8 (calc) (ref <5.0)
Triglycerides: 119 mg/dL (ref <150)

## 2020-02-17 LAB — COMPREHENSIVE METABOLIC PANEL
AG Ratio: 1.5 (calc) (ref 1.0–2.5)
ALT: 24 U/L (ref 6–29)
AST: 22 U/L (ref 10–35)
Albumin: 4.3 g/dL (ref 3.6–5.1)
Alkaline phosphatase (APISO): 79 U/L (ref 31–125)
BUN: 17 mg/dL (ref 7–25)
CO2: 29 mmol/L (ref 20–32)
Calcium: 9.6 mg/dL (ref 8.6–10.2)
Chloride: 104 mmol/L (ref 98–110)
Creat: 0.64 mg/dL (ref 0.50–1.10)
Globulin: 2.9 g/dL (calc) (ref 1.9–3.7)
Glucose, Bld: 95 mg/dL (ref 65–99)
Potassium: 4.6 mmol/L (ref 3.5–5.3)
Sodium: 138 mmol/L (ref 135–146)
Total Bilirubin: 0.5 mg/dL (ref 0.2–1.2)
Total Protein: 7.2 g/dL (ref 6.1–8.1)

## 2020-02-17 LAB — TSH: TSH: 2.32 mIU/L

## 2020-02-17 LAB — VITAMIN D 25 HYDROXY (VIT D DEFICIENCY, FRACTURES): Vit D, 25-Hydroxy: 12 ng/mL — ABNORMAL LOW (ref 30–100)

## 2020-02-17 MED ORDER — VALACYCLOVIR HCL 1 G PO TABS: 1000.0000 mg | ORAL_TABLET | Freq: Every day | ORAL | 1 refills | Status: DC

## 2020-02-17 NOTE — Progress Notes (Addendum)
Kristen Hammond 04/06/1971 [id number]   History:    49 y.o.  G2P2L2 Married.  75 yo daughter and 76 yo son.  RP:  Established patient presenting for annual gyn exam   HPI: Oligomenorrhea with menses in 10/2019 and then very light flow 01/2020.  Occasional night sweats.  No pelvic pain.  No increased vaginal discharge.  No pain with IC.  Urine/BMs wnl.  Breasts wnl.  BMI 24.14.  H/O LEEP >10 yrs ago.  Health labs here today.   Past medical history,surgical history, family history and social history were all reviewed and documented in the EPIC chart.  Gynecologic History Patient's last menstrual period was 02/08/2020.   Obstetric History OB History  Gravida Para Term Preterm AB Living  '2 2       2  '$ SAB TAB Ectopic Multiple Live Births               # Outcome Date GA Lbr Len/2nd Weight Sex Delivery Anes PTL Lv  2 Para           1 Para              ROS: A ROS was performed and pertinent positives and negatives are included in the history.  GENERAL: No fevers or chills. HEENT: No change in vision, no earache, sore throat or sinus congestion. NECK: No pain or stiffness. CARDIOVASCULAR: No chest pain or pressure. No palpitations. PULMONARY: No shortness of breath, cough or wheeze. GASTROINTESTINAL: No abdominal pain, nausea, vomiting or diarrhea, melena or bright red blood per rectum. GENITOURINARY: No urinary frequency, urgency, hesitancy or dysuria. MUSCULOSKELETAL: No joint or muscle pain, no back pain, no recent trauma. DERMATOLOGIC: No rash, no itching, no lesions. ENDOCRINE: No polyuria, polydipsia, no heat or cold intolerance. No recent change in weight. HEMATOLOGICAL: No anemia or easy bruising or bleeding. NEUROLOGIC: No headache, seizures, numbness, tingling or weakness. PSYCHIATRIC: No depression, no loss of interest in normal activity or change in sleep pattern.     Exam:   BP 134/82   Ht '5\' 2"'$  (1.575 m)   Wt 132 lb (59.9 kg)   LMP 02/08/2020   BMI 24.14 kg/m    Body mass index is 24.14 kg/m.  General appearance : Well developed well nourished female. No acute distress HEENT: Eyes: no retinal hemorrhage or exudates,  Neck supple, trachea midline, no carotid bruits, no thyroidmegaly Lungs: Clear to auscultation, no rhonchi or wheezes, or rib retractions  Heart: Regular rate and rhythm, no murmurs or gallops Breast:Examined in sitting and supine position were symmetrical in appearance, no palpable masses or tenderness,  no skin retraction, no nipple inversion, no nipple discharge, no skin discoloration, no axillary or supraclavicular lymphadenopathy Abdomen: no palpable masses or tenderness, no rebound or guarding Extremities: no edema or skin discoloration or tenderness  Pelvic: Vulva: Normal             Vagina: No gross lesions or discharge  Cervix: No gross lesions or discharge.  Pap reflex done.  Uterus  AV, normal size, shape and consistency, non-tender and mobile  Adnexa  Without masses or tenderness  Anus: Normal   Assessment/Plan:  49 y.o. female for annual exam   1. Encounter for routine gynecological examination with Papanicolaou smear of cervix Normal gynecologic exam.  Pap reflex done.  Breast exam normal.  Patient will schedule a screening mammogram now.  Fasting health labs here today.  Good stable body mass index at 24.14.  Increase physical activities with aerobic  activities 5 times a week and light weightlifting every 2 days.  Continue with healthy nutrition. - CBC - Comp Met (CMET) - TSH - Lipid panel - VITAMIN D 25 Hydroxy (Vit-D Deficiency, Fractures)  2. Perimenopause Oligomenorrhea and occasional night sweats.  Will observe at this time.  Recommend vitamin D supplements, calcium intake of 1200 mg daily and regular weightbearing physical activities.  3. Relies on partner vasectomy for contraception  4. H/O LEEP Pap reflex done today.  5. Recurrent oral herpes simplex  Other orders - valACYclovir (VALTREX) 1000  MG tablet; Take 1 tablet (1,000 mg total) by mouth daily for 5 days.  Princess Bruins MD, 8:18 AM 02/17/2020

## 2020-02-17 NOTE — Addendum Note (Signed)
Addended by: Genia Del on: 02/17/2020 11:04 AM   Modules accepted: Orders

## 2020-02-17 NOTE — Patient Instructions (Signed)
  1. Encounter for routine gynecological examination with Papanicolaou smear of cervix Normal gynecologic exam.  Pap reflex done.  Breast exam normal.  Patient will schedule a screening mammogram now.  Fasting health labs here today.  Good stable body mass index at 24.14.  Increase physical activities with aerobic activities 5 times a week and light weightlifting every 2 days.  Continue with healthy nutrition. - CBC - Comp Met (CMET) - TSH - Lipid panel - VITAMIN D 25 Hydroxy (Vit-D Deficiency, Fractures)  2. Perimenopause Oligomenorrhea and occasional night sweats.  Will observe at this time.  Recommend vitamin D supplements, calcium intake of 1200 mg daily and regular weightbearing physical activities.  3. Relies on partner vasectomy for contraception  4. H/O LEEP Pap reflex done today.  Kristen Hammond, it was a pleasure seeing you today!  I will inform you of your results as soon as they are available.

## 2020-02-17 NOTE — Addendum Note (Signed)
Addended by: Berna Spare A on: 02/17/2020 08:54 AM   Modules accepted: Orders

## 2020-02-18 LAB — PAP IG W/ RFLX HPV ASCU

## 2020-02-21 ENCOUNTER — Other Ambulatory Visit: Payer: Self-pay

## 2020-02-21 DIAGNOSIS — E559 Vitamin D deficiency, unspecified: Secondary | ICD-10-CM

## 2020-02-21 MED ORDER — VITAMIN D (ERGOCALCIFEROL) 1.25 MG (50000 UNIT) PO CAPS: 50000.0000 [IU] | ORAL_CAPSULE | ORAL | 0 refills | Status: DC

## 2020-03-14 ENCOUNTER — Other Ambulatory Visit: Payer: Self-pay | Admitting: Obstetrics & Gynecology

## 2020-03-14 ENCOUNTER — Encounter: Payer: BLUE CROSS/BLUE SHIELD | Admitting: Obstetrics & Gynecology

## 2020-04-18 ENCOUNTER — Other Ambulatory Visit: Payer: Self-pay | Admitting: Obstetrics & Gynecology

## 2020-05-07 ENCOUNTER — Other Ambulatory Visit: Payer: Self-pay | Admitting: Obstetrics & Gynecology

## 2020-05-07 DIAGNOSIS — E559 Vitamin D deficiency, unspecified: Secondary | ICD-10-CM

## 2020-05-17 ENCOUNTER — Other Ambulatory Visit: Payer: Self-pay | Admitting: Obstetrics & Gynecology

## 2020-06-26 ENCOUNTER — Other Ambulatory Visit: Payer: Self-pay | Admitting: Women's Health

## 2020-06-26 ENCOUNTER — Other Ambulatory Visit: Payer: Self-pay | Admitting: Family Medicine

## 2020-06-26 DIAGNOSIS — Z1231 Encounter for screening mammogram for malignant neoplasm of breast: Secondary | ICD-10-CM

## 2020-07-07 ENCOUNTER — Ambulatory Visit
Admission: RE | Admit: 2020-07-07 | Discharge: 2020-07-07 | Disposition: A | Discharge status: Home / Self Care | Payer: BLUE CROSS/BLUE SHIELD | Source: Ambulatory Visit | Attending: Family Medicine | Admitting: Family Medicine

## 2020-07-07 ENCOUNTER — Other Ambulatory Visit: Payer: Self-pay

## 2020-07-07 DIAGNOSIS — Z1231 Encounter for screening mammogram for malignant neoplasm of breast: Secondary | ICD-10-CM

## 2020-07-24 ENCOUNTER — Other Ambulatory Visit: Payer: 59

## 2020-08-15 ENCOUNTER — Other Ambulatory Visit: Payer: Self-pay

## 2020-08-15 ENCOUNTER — Other Ambulatory Visit: Payer: 59

## 2020-08-15 DIAGNOSIS — E559 Vitamin D deficiency, unspecified: Secondary | ICD-10-CM

## 2020-08-15 LAB — VITAMIN D 25 HYDROXY (VIT D DEFICIENCY, FRACTURES): Vit D, 25-Hydroxy: 22 ng/mL — ABNORMAL LOW (ref 30–100)

## 2020-08-22 ENCOUNTER — Other Ambulatory Visit: Payer: Self-pay | Admitting: Anesthesiology

## 2020-08-22 DIAGNOSIS — E559 Vitamin D deficiency, unspecified: Secondary | ICD-10-CM

## 2020-08-22 MED ORDER — VITAMIN D (ERGOCALCIFEROL) 1.25 MG (50000 UNIT) PO CAPS: 50000.0000 [IU] | ORAL_CAPSULE | ORAL | 0 refills | Status: DC

## 2021-04-25 ENCOUNTER — Encounter: Payer: Self-pay | Admitting: Obstetrics & Gynecology

## 2021-04-25 ENCOUNTER — Ambulatory Visit (INDEPENDENT_AMBULATORY_CARE_PROVIDER_SITE_OTHER): Payer: 59 | Admitting: Obstetrics & Gynecology

## 2021-04-25 ENCOUNTER — Other Ambulatory Visit: Payer: Self-pay

## 2021-04-25 ENCOUNTER — Other Ambulatory Visit (HOSPITAL_COMMUNITY)
Admission: RE | Admit: 2021-04-25 | Discharge: 2021-04-25 | Disposition: A | Discharge status: Home / Self Care | Payer: 59 | Source: Ambulatory Visit | Attending: Obstetrics & Gynecology | Admitting: Obstetrics & Gynecology

## 2021-04-25 VITALS — BP 122/78 | Ht 62.0 in | Wt 135.0 lb

## 2021-04-25 DIAGNOSIS — N914 Secondary oligomenorrhea: Secondary | ICD-10-CM | POA: Diagnosis not present

## 2021-04-25 DIAGNOSIS — Z01419 Encounter for gynecological examination (general) (routine) without abnormal findings: Secondary | ICD-10-CM

## 2021-04-25 DIAGNOSIS — Z9889 Other specified postprocedural states: Secondary | ICD-10-CM

## 2021-04-25 DIAGNOSIS — E559 Vitamin D deficiency, unspecified: Secondary | ICD-10-CM

## 2021-04-25 DIAGNOSIS — R6882 Decreased libido: Secondary | ICD-10-CM

## 2021-04-25 DIAGNOSIS — Z9189 Other specified personal risk factors, not elsewhere classified: Secondary | ICD-10-CM | POA: Diagnosis not present

## 2021-04-25 MED ORDER — MEDROXYPROGESTERONE ACETATE 10 MG PO TABS: 10.0000 mg | ORAL_TABLET | Freq: Every day | ORAL | 4 refills | Status: DC

## 2021-04-25 NOTE — Progress Notes (Signed)
Kristen Hammond 1971/02/07 [id number]   History:    50 y.o. G2P2L2 Married. 51 yo daughter and 61 yo son.  EZ:[id number] presenting for annual gyn exam   CLE:[id number], LMP normal in 12/2020.  Periods every 3-5 months.  Occasional hot flushes and night sweats.  No pelvic pain. No increased vaginal discharge. No pain with IC.  C/O low libido. Urine/BMs wnl. Breasts wnl. BMI 24.69. H/O LEEP >10 yrs ago.  Health labs with Fam MD.  Would like to complete with a Vit D.  Needs to schedule a Colono.  Past medical history,surgical history, family history and social history were all reviewed and documented in the EPIC chart.  Gynecologic History Patient's last menstrual period was 12/26/2020.  Obstetric History OB History  Gravida Para Term Preterm AB Living  2 2       2   SAB IAB Ectopic Multiple Live Births               # Outcome Date GA Lbr Len/2nd Weight Sex Delivery Anes PTL Lv  2 Para           1 Para              ROS: A ROS was performed and pertinent positives and negatives are included in the history.  GENERAL: No fevers or chills. HEENT: No change in vision, no earache, sore throat or sinus congestion. NECK: No pain or stiffness. CARDIOVASCULAR: No chest pain or pressure. No palpitations. PULMONARY: No shortness of breath, cough or wheeze. GASTROINTESTINAL: No abdominal pain, nausea, vomiting or diarrhea, melena or bright red blood per rectum. GENITOURINARY: No urinary frequency, urgency, hesitancy or dysuria. MUSCULOSKELETAL: No joint or muscle pain, no back pain, no recent trauma. DERMATOLOGIC: No rash, no itching, no lesions. ENDOCRINE: No polyuria, polydipsia, no heat or cold intolerance. No recent change in weight. HEMATOLOGICAL: No anemia or easy bruising or bleeding. NEUROLOGIC: No headache, seizures, numbness, tingling or weakness. PSYCHIATRIC: No depression, no loss of interest in normal activity or change in sleep pattern.     Exam:   BP  122/78   Ht 5\' 2"  (1.575 m)   Wt 135 lb (61.2 kg)   LMP 12/26/2020   BMI 24.69 kg/m   Body mass index is 24.69 kg/m.  General appearance : Well developed well nourished female. No acute distress HEENT: Eyes: no retinal hemorrhage or exudates,  Neck supple, trachea midline, no carotid bruits, no thyroidmegaly Lungs: Clear to auscultation, no rhonchi or wheezes, or rib retractions  Heart: Regular rate and rhythm, no murmurs or gallops Breast:Examined in sitting and supine position were symmetrical in appearance, no palpable masses or tenderness,  no skin retraction, no nipple inversion, no nipple discharge, no skin discoloration, no axillary or supraclavicular lymphadenopathy Abdomen: no palpable masses or tenderness, no rebound or guarding Extremities: no edema or skin discoloration or tenderness  Pelvic: Vulva: Normal             Vagina: No gross lesions or discharge  Cervix: No gross lesions or discharge.  Pap reflex done.  Uterus  RV, normal size, shape and consistency, non-tender and mobile  Adnexa  Without masses or tenderness  Anus: Normal   Assessment/Plan:  50 y.o. female for annual exam   1. Encounter for routine gynecological examination with Papanicolaou smear of cervix Normal gynecologic exam in the perimenopause.  Pap reflex done.  Breast exam normal.  Screening mammogram July 2021 was negative.  Needs to organize a colonoscopy.  Health labs with  family physician.  We will complete with a vitamin D level today.  Body mass index is good at 24.69.  Continue with fitness and healthy nutrition. - Cytology - PAP( [place])  2. H/O LEEP Pap reflex done today.  3. Secondary oligomenorrhea Mildly symptomatic perimenopause.  Decision to start on cyclic Provera to bring a withdrawal bleeding every 3 months if no spontaneous menstrual period.  Benefits, risks and usage reviewed.  Prescription sent to pharmacy.  4. Relies on partner vasectomy for contraception  5. Low  libido Counseling on low libido done.  Decision to start on Testosterone cream.  We will send prescription to [place]. - Testos,Total,Free and SHBG (Female)  6. Vitamin D deficiency - Vitamin D 1,25 dihydroxy  Other orders - medroxyPROGESTERone (PROVERA) 10 MG tablet; Take 1 tablet (10 mg total) by mouth daily for 10 days. Cyclic Provera 1 tab PO daily x 10 days every 3 months if no spontaneous menstrual period.  Genia Del MD, 11:15 AM 04/25/2021

## 2021-04-26 ENCOUNTER — Telehealth: Payer: Self-pay | Admitting: *Deleted

## 2021-04-26 MED ORDER — NONFORMULARY OR COMPOUNDED ITEM: 4 refills | Status: DC

## 2021-04-26 NOTE — Telephone Encounter (Signed)
Rx called in 

## 2021-04-26 NOTE — Telephone Encounter (Signed)
-----   Message from Genia Del, MD sent at 04/25/2021 11:40 AM EDT ----- Regarding: [facility] prescription Low libido.  Testosterone cream daily x 3 month supply.  Refill x 4.

## 2021-04-27 LAB — CYTOLOGY - PAP: Diagnosis: NEGATIVE

## 2021-05-01 LAB — VITAMIN D 1,25 DIHYDROXY
Vitamin D 1, 25 (OH)2 Total: 58 pg/mL (ref 18–72)
Vitamin D3 1, 25 (OH)2: 31 pg/mL

## 2021-05-07 LAB — TESTOS,TOTAL,FREE AND SHBG (FEMALE): Sex Hormone Binding: 31 nmol/L (ref 17–124)

## 2021-05-07 LAB — VITAMIN D 1,25 DIHYDROXY: Vitamin D2 1, 25 (OH)2: 27 pg/mL

## 2021-05-22 ENCOUNTER — Other Ambulatory Visit: Payer: Self-pay | Admitting: *Deleted

## 2021-05-22 DIAGNOSIS — R6882 Decreased libido: Secondary | ICD-10-CM

## 2021-05-31 ENCOUNTER — Other Ambulatory Visit: Payer: Self-pay | Admitting: Family Medicine

## 2021-05-31 DIAGNOSIS — Z1231 Encounter for screening mammogram for malignant neoplasm of breast: Secondary | ICD-10-CM

## 2021-06-26 ENCOUNTER — Telehealth: Payer: Self-pay | Admitting: *Deleted

## 2021-06-26 NOTE — Telephone Encounter (Signed)
Patient daughter Kristen Hammond stating her mother's Rx for cream was never sent to [facility]. I called daughter back [phone] and informed her that in May testosterone cream 2% was called into [facility]. Patient was not aware of this. I provided daughter with number to [facility] city and told her I would call pharmacy again to fill the Rx.

## 2021-07-25 ENCOUNTER — Other Ambulatory Visit: Payer: Self-pay

## 2021-07-25 ENCOUNTER — Ambulatory Visit
Admission: RE | Admit: 2021-07-25 | Discharge: 2021-07-25 | Disposition: A | Discharge status: Home / Self Care | Payer: 59 | Source: Ambulatory Visit | Attending: Family Medicine | Admitting: Family Medicine

## 2021-07-25 DIAGNOSIS — Z1231 Encounter for screening mammogram for malignant neoplasm of breast: Secondary | ICD-10-CM

## 2022-01-05 IMAGING — MG MM DIGITAL SCREENING BILAT W/ TOMO AND CAD
6 of 10 series · 6 of 30 positions shown · non-contrast
Comparison: Previous exam(s).

CLINICAL DATA: Screening.

EXAM:
DIGITAL SCREENING BILATERAL MAMMOGRAM WITH TOMOSYNTHESIS AND CAD
TECHNIQUE: Bilateral screening digital craniocaudal and mediolateral oblique
mammograms were obtained. Bilateral screening digital breast
tomosynthesis was performed. The images were evaluated with
computer-aided detection.

[R CC synth-2D (1 of 2)]
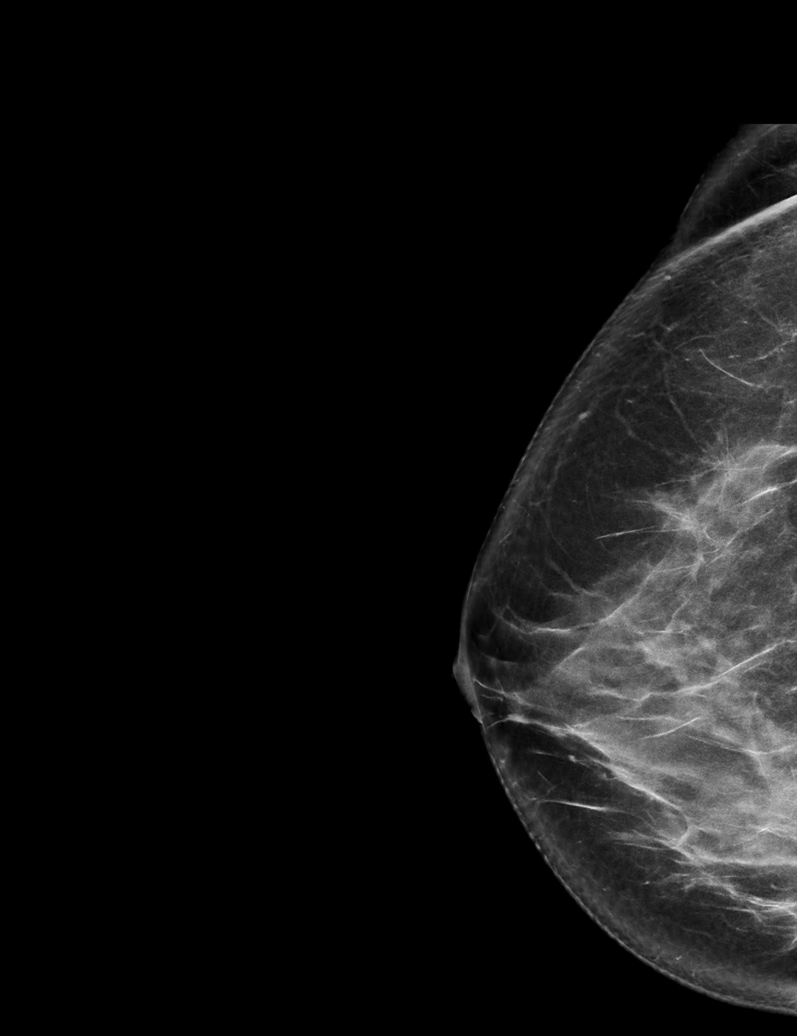

[L MLO synth-2D]
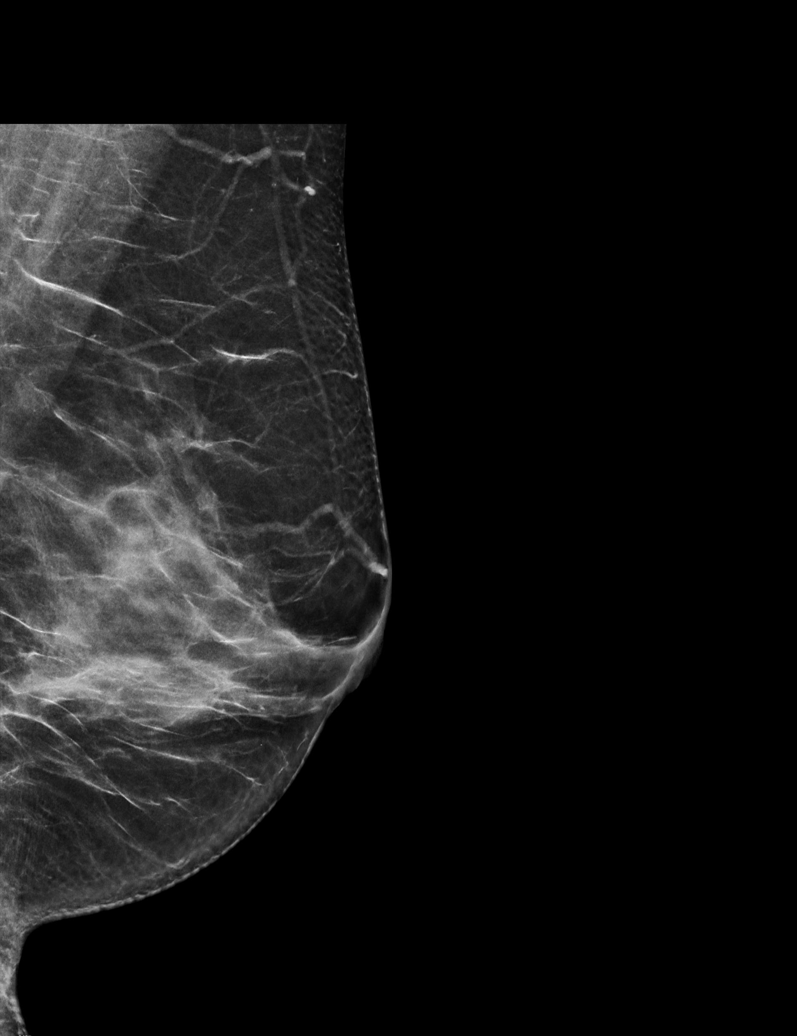

[R MLO synth-2D]
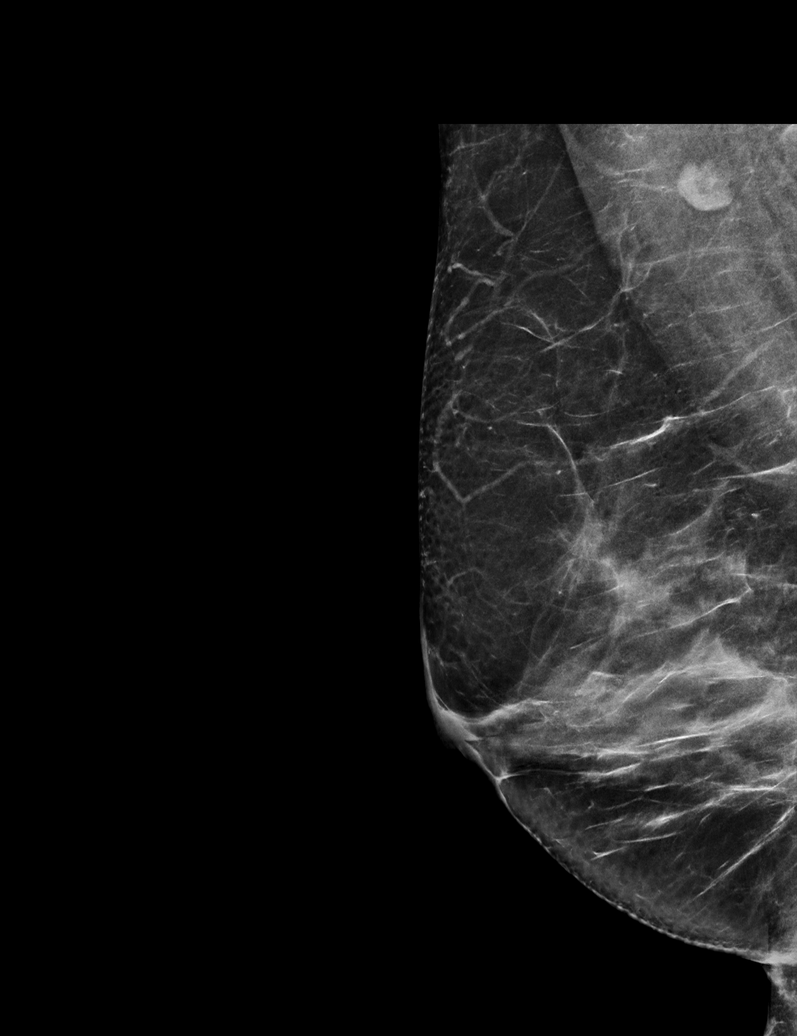

[R CC synth-2D (2 of 2)]
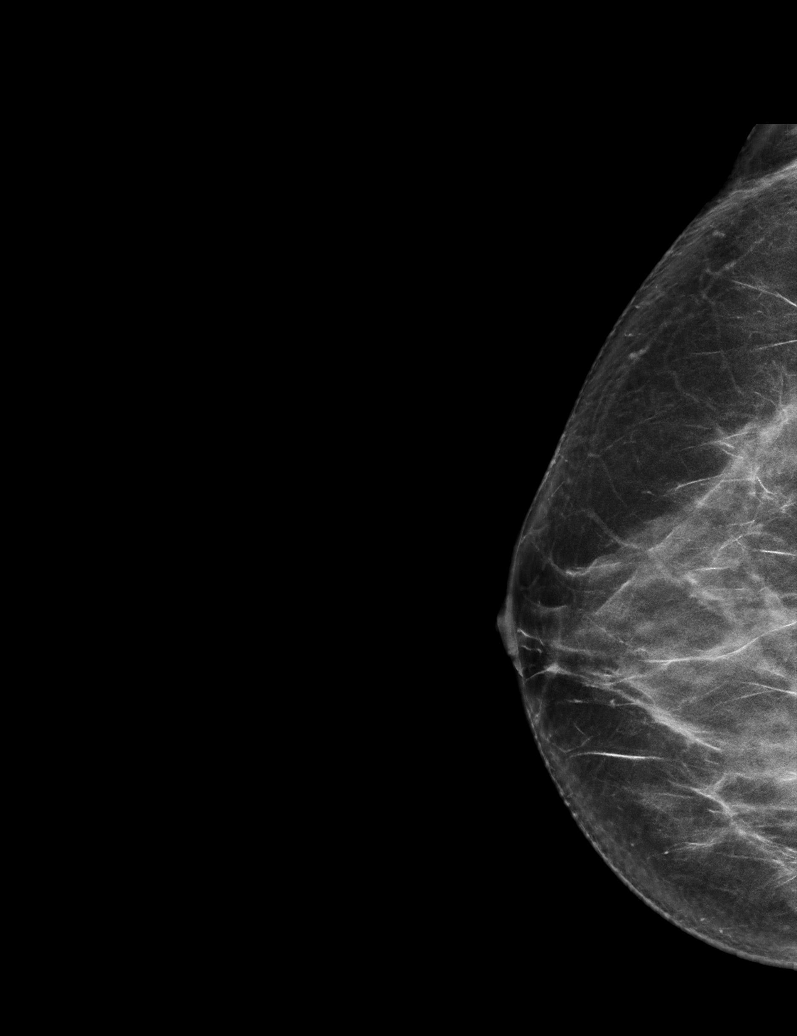

[L CC synth-2D]
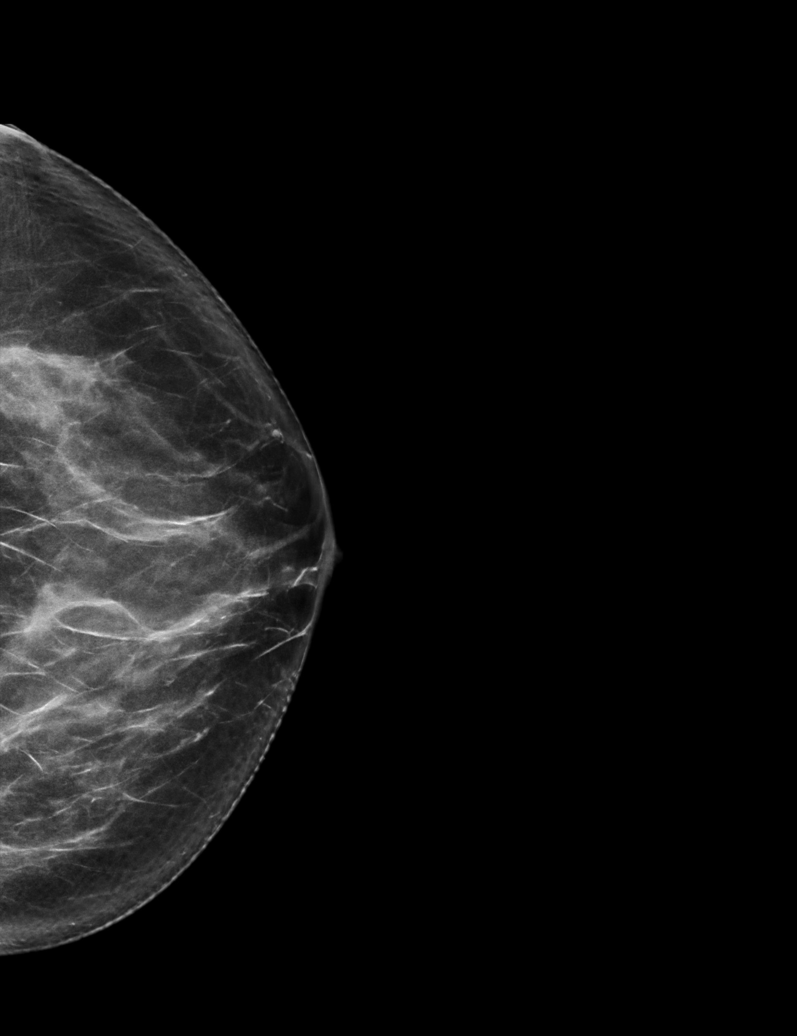

[R MLO tomo · tomo slice 35/70.0]
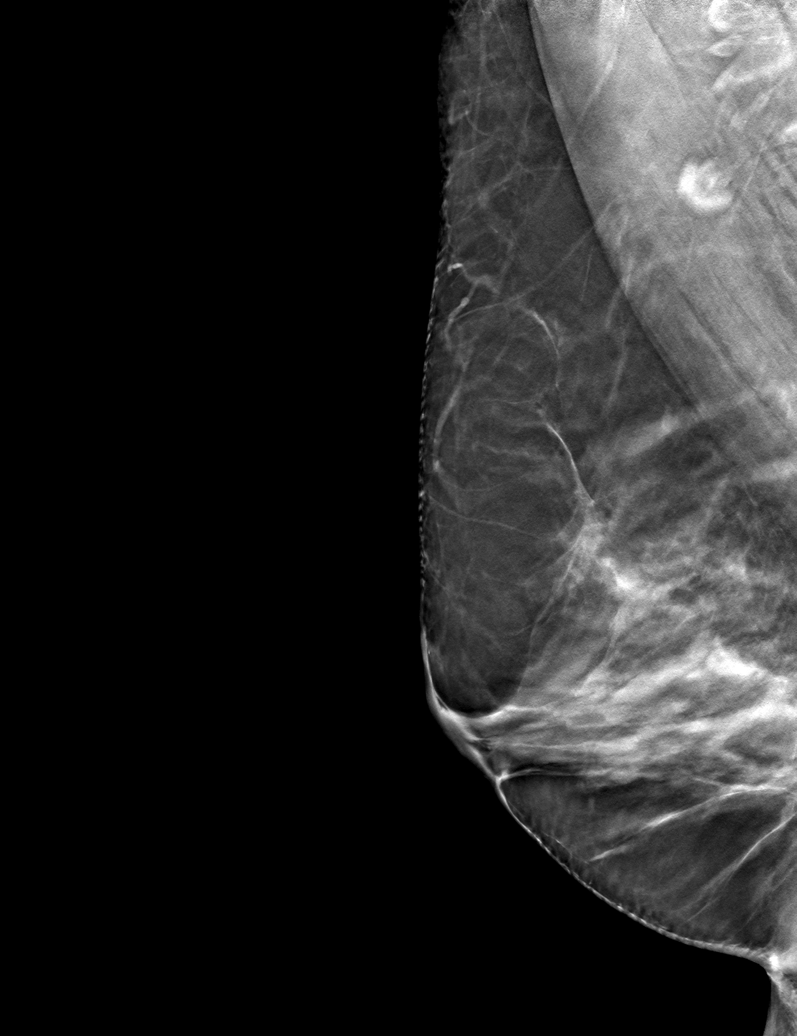

[6 of 30 positions shown; findings below may reference images not displayed]

ACR Breast Density Category c: The breast tissue is heterogeneously
dense, which may obscure small masses.
FINDINGS: There are no findings suspicious for malignancy.
IMPRESSION: No mammographic evidence of malignancy. A result letter of this
screening mammogram will be mailed directly to the patient.

RECOMMENDATION:
Screening mammogram in one year. (Code:Q3-W-BC3)

BI-RADS CATEGORY  1: Negative.

## 2022-01-15 ENCOUNTER — Encounter (INDEPENDENT_AMBULATORY_CARE_PROVIDER_SITE_OTHER): Payer: Self-pay | Admitting: Ophthalmology

## 2022-04-26 ENCOUNTER — Encounter: Payer: Self-pay | Admitting: Obstetrics & Gynecology

## 2022-04-26 ENCOUNTER — Ambulatory Visit (INDEPENDENT_AMBULATORY_CARE_PROVIDER_SITE_OTHER): Payer: Commercial Managed Care - HMO | Admitting: Obstetrics & Gynecology

## 2022-04-26 ENCOUNTER — Other Ambulatory Visit (HOSPITAL_COMMUNITY)
Admission: RE | Admit: 2022-04-26 | Discharge: 2022-04-26 | Disposition: A | Discharge status: Home / Self Care | Payer: Commercial Managed Care - HMO | Source: Ambulatory Visit | Attending: Obstetrics & Gynecology | Admitting: Obstetrics & Gynecology

## 2022-04-26 VITALS — BP 118/80 | HR 60 | Resp 16 | Ht 61.25 in | Wt 135.0 lb

## 2022-04-26 DIAGNOSIS — Z9189 Other specified personal risk factors, not elsewhere classified: Secondary | ICD-10-CM

## 2022-04-26 DIAGNOSIS — N951 Menopausal and female climacteric states: Secondary | ICD-10-CM

## 2022-04-26 DIAGNOSIS — Z9889 Other specified postprocedural states: Secondary | ICD-10-CM | POA: Diagnosis not present

## 2022-04-26 DIAGNOSIS — Z01419 Encounter for gynecological examination (general) (routine) without abnormal findings: Secondary | ICD-10-CM | POA: Diagnosis present

## 2022-04-26 DIAGNOSIS — R7989 Other specified abnormal findings of blood chemistry: Secondary | ICD-10-CM

## 2022-04-26 DIAGNOSIS — M545 Low back pain, unspecified: Secondary | ICD-10-CM

## 2022-04-26 NOTE — Progress Notes (Signed)
? ? ?Kristen Hammond 1971-11-05 [id number] ? ? ?History:    51 y.o. G2P2L2 Married.  47 yo daughter and 43 yo son. ?  ?RP:  Established patient presenting for annual gyn exam  ?  ?HPI: Perimenopause with oligomenorrhea, LMP normal in 06/2021. Occasional mild hot flushes and night sweats.  No pelvic pain.  No increased vaginal discharge.  Pap Neg 04/2021.  Pap reflex today.  H/O LEEP >10 yrs ago.  No pain with IC. Had Laser in the vagina in [place].  Mild lower back pain x 2 days, no UTI Sx otherwise.  BMs wnl.  Breasts wnl.  Mammo Neg 07/2022.  BMI 25.3.  Health labs with Fam MD.  Would like to complete with a Vit D.  Colono scheduled in July 2023. ? ?Past medical history,surgical history, family history and social history were all reviewed and documented in the EPIC chart. ? ?Gynecologic History ?No LMP recorded. Patient is perimenopausal. ? ?Obstetric History ?OB History  ?Gravida Para Term Preterm AB Living  ?2 2       2   ?SAB IAB Ectopic Multiple Live Births  ?           ?  ?# Outcome Date GA Lbr Len/2nd Weight Sex Delivery Anes PTL Lv  ?2 Para           ?1 Para           ? ? ? ?ROS: A ROS was performed and pertinent positives and negatives are included in the history. ?GENERAL: No fevers or chills. HEENT: No change in vision, no earache, sore throat or sinus congestion. NECK: No pain or stiffness. CARDIOVASCULAR: No chest pain or pressure. No palpitations. PULMONARY: No shortness of breath, cough or wheeze. GASTROINTESTINAL: No abdominal pain, nausea, vomiting or diarrhea, melena or bright red blood per rectum. GENITOURINARY: No urinary frequency, urgency, hesitancy or dysuria. MUSCULOSKELETAL: No joint or muscle pain, no back pain, no recent trauma. DERMATOLOGIC: No rash, no itching, no lesions. ENDOCRINE: No polyuria, polydipsia, no heat or cold intolerance. No recent change in weight. HEMATOLOGICAL: No anemia or easy bruising or bleeding. NEUROLOGIC: No headache, seizures, numbness, tingling or weakness.  PSYCHIATRIC: No depression, no loss of interest in normal activity or change in sleep pattern.  ?  ? ?Exam: ? ? ?BP 118/80   Pulse 60   Resp 16   Ht 5' 1.25" (1.556 m)   Wt 135 lb (61.2 kg)   BMI 25.30 kg/m?  ? ?Body mass index is 25.3 kg/m?. ? ?General appearance : Well developed well nourished female. No acute distress ?HEENT: Eyes: no retinal hemorrhage or exudates,  Neck supple, trachea midline, no carotid bruits, no thyroidmegaly ?Lungs: Clear to auscultation, no rhonchi or wheezes, or rib retractions  ?Heart: Regular rate and rhythm, no murmurs or gallops ?Breast:Examined in sitting and supine position were symmetrical in appearance, no palpable masses or tenderness,  no skin retraction, no nipple inversion, no nipple discharge, no skin discoloration, no axillary or supraclavicular lymphadenopathy ?Abdomen: no palpable masses or tenderness, no rebound or guarding ?Extremities: no edema or skin discoloration or tenderness ? ?Pelvic: Vulva: Normal ?            Vagina: No gross lesions or discharge ? Cervix: No gross lesions or discharge.  Pap reflex done. ? Uterus  RV, normal size, shape and consistency, non-tender and mobile ? Adnexa  Without masses or tenderness ? Anus: Normal ? ?U/A: Yellow clear, nitrites negative, protein negative, white blood cells 6-10, red blood  cells 0-2, bacteria few.  Urine culture pending. ? ? ?Assessment/Plan:  51 y.o. female for annual exam  ? ?1. Encounter for routine gynecological examination with Papanicolaou smear of cervix ?Perimenopause with oligomenorrhea, LMP normal in 06/2021. Occasional mild hot flushes and night sweats.  No pelvic pain.  No increased vaginal discharge.  Pap Neg 04/2021.  Pap reflex today.  H/O LEEP >10 yrs ago.  No pain with IC. Had Laser in the vagina in [place].  Mild lower back pain x 2 days, no UTI Sx otherwise.  BMs wnl.  Breasts wnl.  Mammo Neg 07/2022.  BMI 25.3.  Health labs with Fam MD.  Would like to complete with a Vit D.  Colono scheduled  in July 2023. ?- Cytology - PAP( [place]) ? ?2. H/O LEEP ?Pap today. ? ?3. Perimenopause ?Perimenopause with oligomenorrhea, LMP normal in 06/2021. Occasional mild hot flushes and night sweats.  No pelvic pain.  No increased vaginal discharge.  ? ?4. Relies on partner vasectomy for contraception ? ?5. Low back pain, unspecified back pain laterality, unspecified chronicity, unspecified whether sciatica present ?Mild lower back pain x 2 days, no UTI Sx otherwise. U/A only mildly perturbed, will wait on Urine Culture to decide on treatment. ?- Urinalysis,Complete w/RFL Culture ? ?6. Low vitamin D level ?- Vitamin D (25 hydroxy) ? ?Other orders ?- valACYclovir (VALTREX) 1000 MG tablet; Take 1,000 mg by mouth daily.  ? ?Genia Del MD, 9:12 AM 04/26/2022 ? ?  ?

## 2022-04-27 LAB — VITAMIN D 25 HYDROXY (VIT D DEFICIENCY, FRACTURES): Vit D, 25-Hydroxy: 41 ng/mL (ref 30–100)

## 2022-04-29 LAB — URINALYSIS, COMPLETE W/RFL CULTURE
Bilirubin Urine: NEGATIVE
Casts: NONE SEEN /LPF
Crystals: NONE SEEN /HPF
Glucose, UA: NEGATIVE
Hyaline Cast: NONE SEEN /LPF
Ketones, ur: NEGATIVE
Nitrites, Initial: NEGATIVE
Protein, ur: NEGATIVE
Specific Gravity, Urine: 1.02 (ref 1.001–1.035)
Yeast: NONE SEEN /HPF
pH: 5.5 (ref 5.0–8.0)

## 2022-04-29 LAB — URINE CULTURE
MICRO NUMBER:: 13388763
Result:: NO GROWTH
SPECIMEN QUALITY:: ADEQUATE

## 2022-04-29 LAB — CYTOLOGY - PAP: Diagnosis: NEGATIVE

## 2022-04-29 LAB — CULTURE INDICATED

## 2022-07-15 ENCOUNTER — Other Ambulatory Visit: Payer: Self-pay | Admitting: Family Medicine

## 2022-07-15 DIAGNOSIS — Z1231 Encounter for screening mammogram for malignant neoplasm of breast: Secondary | ICD-10-CM

## 2022-08-09 ENCOUNTER — Ambulatory Visit
Admission: RE | Admit: 2022-08-09 | Discharge: 2022-08-09 | Disposition: A | Discharge status: Home / Self Care | Payer: Commercial Managed Care - HMO | Source: Ambulatory Visit | Attending: Family Medicine | Admitting: Family Medicine

## 2022-08-09 DIAGNOSIS — Z1231 Encounter for screening mammogram for malignant neoplasm of breast: Secondary | ICD-10-CM

## 2022-11-21 ENCOUNTER — Ambulatory Visit (HOSPITAL_COMMUNITY)
Admission: RE | Admit: 2022-11-21 | Discharge: 2022-11-21 | Disposition: A | Discharge status: Home / Self Care | Payer: Commercial Managed Care - HMO | Source: Ambulatory Visit | Attending: Nurse Practitioner | Admitting: Nurse Practitioner

## 2022-11-21 ENCOUNTER — Encounter (HOSPITAL_COMMUNITY): Payer: Self-pay

## 2022-11-21 VITALS — BP 159/92 | HR 64 | Temp 98.1°F | Resp 16

## 2022-11-21 DIAGNOSIS — H6121 Impacted cerumen, right ear: Secondary | ICD-10-CM | POA: Diagnosis not present

## 2022-11-21 NOTE — ED Triage Notes (Signed)
Pt states she saw PMD for ear fullness last week and was told she had a lot of wax was given debrox but she has not had any change in her ear fullness.

## 2022-11-21 NOTE — ED Provider Notes (Signed)
[facility]    CSN: [id number] Arrival date & time: 11/21/22  1334      History   Chief Complaint Chief Complaint  Patient presents with   Ear Fullness    HPI Kristen Hammond is a 51 y.o. female.   Spanish Interpreter: Carmelia Roller [phone]  Subjective:   Kristen Hammond is a 51 y.o. female who presents for possible ear infection. Symptoms include plugged sensation in the right ear. Onset of symptoms was 5 days ago and has been gradually worsening since that time. Patient went to the beach last weekend and got water in the right ear. Associated symptoms include decreased hearing from the ear, nasal congestion, sneezing, and runny nose. She denies any fevers, chills, body aches, sore throat, cough, nausea, vomiting, headache, dizziness or tinnitus. She is drinking plenty of fluids. She saw her PCP 3 days ago for the same and was told that she had ear wax buildup in the right ear as well as a sinus infection. She was told to use debrox in which the patient has without any relief in her ear symptoms. Patient also reports that she was prescribed medications for her sinus infection but she hasn't started it yet and doesn't remember the name of the medication(s). Patient was referred to ENT and has an appointment with them in January.   The following portions of the patient's history were reviewed and updated as appropriate: allergies, current medications, past family history, past medical history, past social history, past surgical history, and problem list.     Past Medical History:  Diagnosis Date   Hypertension     Patient Active Problem List   Diagnosis Date Noted   Alopecia 04/05/2015    Past Surgical History:  Procedure Laterality Date   ABDOMINAL SURGERY     CERVICAL BIOPSY  W/ LOOP ELECTRODE EXCISION  2006   CESAREAN SECTION     X2    OB History     Gravida  2   Para  2   Term      Preterm      AB      Living  2      SAB      IAB       Ectopic      Multiple      Live Births               Home Medications    Prior to Admission medications   Medication Sig Start Date End Date Taking? Authorizing Provider  aspirin EC 81 MG tablet Take 81 mg by mouth daily.    [provider]  atenolol (TENORMIN) 50 MG tablet TAKE 1 TABLET BY MOUTH EVERY DAY Patient taking differently: Take 50 mg by mouth daily. 07/07/18   Genia Del, MD  TURMERIC PO Take 1 capsule by mouth See admin instructions. Take 1 capsule by mouth one to two times a day    [provider]  valACYclovir (VALTREX) 1000 MG tablet Take 1,000 mg by mouth daily. 02/04/22   [provider]    Family History Family History  Problem Relation Age of Onset   Hyperlipidemia Mother    Heart disease Mother    Hyperlipidemia Father    Hyperlipidemia Maternal Grandmother    Heart disease Maternal Grandmother    Hyperlipidemia Maternal Grandfather    Hyperlipidemia Paternal Grandmother    Hyperlipidemia Paternal Grandfather    Breast cancer Neg Hx     Social History Social History  Tobacco Use   Smoking status: Never   Smokeless tobacco: Never  Vaping Use   Vaping Use: Never used  Substance Use Topics   Alcohol use: No    Alcohol/week: 0.0 standard drinks of alcohol   Drug use: Never     Allergies   Patient has no known allergies.   Review of Systems Review of Systems  Constitutional:  Negative for fever.  HENT:  Positive for congestion, ear pain, rhinorrhea and sneezing. Negative for ear discharge and sore throat.   Respiratory:  Negative for cough.   Gastrointestinal:  Negative for nausea and vomiting.  Neurological:  Negative for dizziness and headaches.  All other systems reviewed and are negative.    Physical Exam Triage Vital Signs ED Triage Vitals  Enc Vitals Group     BP 11/21/22 1409 (!) 159/92     Pulse Rate 11/21/22 1409 64     Resp 11/21/22 1409 16     Temp 11/21/22 1409 98.1 F (36.7 C)      Temp Source 11/21/22 1409 Oral     SpO2 11/21/22 1409 98 %     Weight --      Height --      Head Circumference --      Peak Flow --      Pain Score 11/21/22 1410 3     Pain Loc --      Pain Edu? --      Excl. in GC? --    No data found.  Updated Vital Signs BP (!) 159/92 (BP Location: Right Arm)   Pulse 64   Temp 98.1 F (36.7 C) (Oral)   Resp 16   LMP 12/26/2020   SpO2 98%   Visual Acuity Right Eye Distance:   Left Eye Distance:   Bilateral Distance:    Right Eye Near:   Left Eye Near:    Bilateral Near:     Physical Exam Vitals reviewed.  Constitutional:      General: She is not in acute distress.    Appearance: She is not ill-appearing, toxic-appearing or diaphoretic.  HENT:     Head: Normocephalic.     Right Ear: Hearing and external ear normal. There is impacted cerumen.     Left Ear: Hearing, tympanic membrane, ear canal and external ear normal.     Nose: Rhinorrhea present.     Mouth/Throat:     Mouth: Mucous membranes are moist.  Eyes:     Conjunctiva/sclera: Conjunctivae normal.  Cardiovascular:     Rate and Rhythm: Normal rate and regular rhythm.  Pulmonary:     Effort: Pulmonary effort is normal.     Breath sounds: Normal breath sounds.  Musculoskeletal:        General: Normal range of motion.     Cervical back: Normal range of motion and neck supple.  Skin:    General: Skin is warm and dry.  Neurological:     General: No focal deficit present.     Mental Status: She is alert and oriented to person, place, and time.      UC Treatments / Results  Labs (all labs ordered are listed, but only abnormal results are displayed) Labs Reviewed - No data to display  EKG   Radiology No results found.  Procedures Ear Cerumen Removal  Date/Time: 11/21/2022 3:13 PM  Performed by: Ephraim Hamburger, RN Authorized by: Lurline Idol, FNP   Consent:    Consent obtained:  Verbal   Consent given by:  Patient   Risks, benefits, and  alternatives were discussed: yes     Risks discussed:  Bleeding, infection, pain, TM perforation, incomplete removal and dizziness Universal protocol:    Patient identity confirmed:  Verbally with patient and arm band Procedure details:    Location:  R ear   Procedure type: irrigation     Procedure outcomes: cerumen removed   Post-procedure details:    Inspection:  Ear canal clear, TM intact and no bleeding   Hearing quality:  Improved   Procedure completion:  Tolerated well, no immediate complications  (including critical care time)  Medications Ordered in UC Medications - No data to display  Initial Impression / Assessment and Plan / UC Course  I have reviewed the triage vital signs and the nursing notes.  Pertinent labs & imaging results that were available during my care of the patient were reviewed by me and considered in my medical decision making (see chart for details).    51 yo female presenting with right cerumen impaction. She underwent ear cerumen removal by nursing with great results. Post-procedure assessment by nurse practitioner shows clear canal and no TM perforation or bleeding. Patient reports that she feels much better. Plugged sensation and decreased hearing is completely resolved. Advised patient to start taking the medication(s) that was prescribed by her PCP for her sinus infection. OTC analgesics, fluids, rest. Follow up with PCP as needed.   Today's evaluation has revealed no signs of a dangerous process. Discussed diagnosis with patient and/or guardian. Patient and/or guardian aware of their diagnosis, possible red flag symptoms to watch out for and need for close follow up. Patient and/or guardian understands verbal and written discharge instructions. Patient and/or guardian comfortable with plan and disposition.  Patient and/or guardian has a clear mental status at this time, good insight into illness (after discussion and teaching) and has clear judgment to make  decisions regarding their care  Documentation was completed with the aid of voice recognition software. Transcription may contain typographical errors.   Final Clinical Impressions(s) / UC Diagnoses   Final diagnoses:  Hearing loss of right ear due to cerumen impaction   Discharge Instructions   None    ED Prescriptions   None    PDMP not reviewed this encounter.   Lurline Idol, [place] 11/21/22 1539

## 2023-05-01 ENCOUNTER — Ambulatory Visit: Payer: Commercial Managed Care - HMO | Admitting: Obstetrics & Gynecology
